# Patient Record
Sex: Female | Born: 1975
Health system: Southern US, Community
[De-identification: ages and names within clinical notes are randomized; demographics above are authoritative.]

## PROBLEM LIST (undated history)

## (undated) DIAGNOSIS — T8339XA Other mechanical complication of intrauterine contraceptive device, initial encounter: Secondary | ICD-10-CM

## (undated) DIAGNOSIS — T7840XA Allergy, unspecified, initial encounter: Secondary | ICD-10-CM

## (undated) DIAGNOSIS — R519 Headache, unspecified: Secondary | ICD-10-CM

## (undated) DIAGNOSIS — T8332XA Displacement of intrauterine contraceptive device, initial encounter: Secondary | ICD-10-CM

## (undated) DIAGNOSIS — F32A Depression, unspecified: Secondary | ICD-10-CM

## (undated) DIAGNOSIS — F329 Major depressive disorder, single episode, unspecified: Secondary | ICD-10-CM

## (undated) DIAGNOSIS — F419 Anxiety disorder, unspecified: Secondary | ICD-10-CM

## (undated) DIAGNOSIS — R87629 Unspecified abnormal cytological findings in specimens from vagina: Secondary | ICD-10-CM

## (undated) DIAGNOSIS — N390 Urinary tract infection, site not specified: Secondary | ICD-10-CM

## (undated) DIAGNOSIS — F988 Other specified behavioral and emotional disorders with onset usually occurring in childhood and adolescence: Secondary | ICD-10-CM

## (undated) DIAGNOSIS — G4700 Insomnia, unspecified: Secondary | ICD-10-CM

## (undated) DIAGNOSIS — N912 Amenorrhea, unspecified: Secondary | ICD-10-CM

## (undated) HISTORY — DX: Anxiety disorder, unspecified: F41.9

## (undated) HISTORY — DX: Other specified behavioral and emotional disorders with onset usually occurring in childhood and adolescence: F98.8

## (undated) HISTORY — PX: BILATERAL SALPINGECTOMY: SHX5743

## (undated) HISTORY — DX: Unspecified abnormal cytological findings in specimens from vagina: R87.629

## (undated) HISTORY — DX: Headache, unspecified: R51.9

## (undated) HISTORY — DX: Allergy, unspecified, initial encounter: T78.40XA

## (undated) HISTORY — DX: Displacement of intrauterine contraceptive device, initial encounter: T83.32XA

## (undated) HISTORY — DX: Other mechanical complication of intrauterine contraceptive device, initial encounter: T83.39XA

## (undated) HISTORY — DX: Amenorrhea, unspecified: N91.2

## (undated) HISTORY — PX: HERNIA REPAIR: SHX51

## (undated) HISTORY — PX: CHOLECYSTECTOMY: SHX55

## (undated) HISTORY — PX: OTHER SURGICAL HISTORY: SHX169

## (undated) HISTORY — DX: Urinary tract infection, site not specified: N39.0

## (undated) HISTORY — DX: Major depressive disorder, single episode, unspecified: F32.9

## (undated) HISTORY — DX: Depression, unspecified: F32.A

## (undated) HISTORY — PX: BACK SURGERY: SHX140

## (undated) HISTORY — DX: Insomnia, unspecified: G47.00

---

## 2006-10-25 ENCOUNTER — Emergency Department: Payer: Self-pay | Admitting: Emergency Medicine

## 2009-12-18 ENCOUNTER — Ambulatory Visit: Payer: Self-pay | Admitting: Family Medicine

## 2010-06-18 ENCOUNTER — Emergency Department: Payer: Self-pay | Admitting: Emergency Medicine

## 2010-06-21 ENCOUNTER — Emergency Department: Payer: Self-pay | Admitting: Emergency Medicine

## 2012-03-30 ENCOUNTER — Emergency Department: Payer: Self-pay | Admitting: Emergency Medicine

## 2012-04-23 ENCOUNTER — Ambulatory Visit: Payer: Self-pay | Admitting: Surgery

## 2012-04-23 LAB — PREGNANCY, URINE: Pregnancy Test, Urine: NEGATIVE m[IU]/mL

## 2012-04-30 ENCOUNTER — Ambulatory Visit: Payer: Self-pay | Admitting: Surgery

## 2012-04-30 LAB — PREGNANCY, URINE: Pregnancy Test, Urine: NEGATIVE m[IU]/mL

## 2014-12-23 ENCOUNTER — Inpatient Hospital Stay: Payer: Self-pay | Admitting: Internal Medicine

## 2014-12-23 LAB — COMPREHENSIVE METABOLIC PANEL
Albumin: 3.3 g/dL — ABNORMAL LOW (ref 3.4–5.0)
Alkaline Phosphatase: 100 U/L (ref 46–116)
Anion Gap: 9 (ref 7–16)
BILIRUBIN TOTAL: 0.5 mg/dL (ref 0.2–1.0)
BUN: 8 mg/dL (ref 7–18)
CHLORIDE: 104 mmol/L (ref 98–107)
Calcium, Total: 9 mg/dL (ref 8.5–10.1)
Co2: 24 mmol/L (ref 21–32)
Creatinine: 0.86 mg/dL (ref 0.60–1.30)
EGFR (Non-African Amer.): >60
Glucose: 121 mg/dL — ABNORMAL HIGH (ref 65–99)
Osmolality: 273 (ref 275–301)
POTASSIUM: 3.2 mmol/L — AB (ref 3.5–5.1)
SGOT(AST): 33 U/L (ref 15–37)
SGPT (ALT): 33 U/L (ref 14–63)
Sodium: 137 mmol/L (ref 136–145)
TOTAL PROTEIN: 7.3 g/dL (ref 6.4–8.2)

## 2014-12-23 LAB — URINALYSIS, COMPLETE
Bilirubin,UR: NEGATIVE
GLUCOSE, UR: NEGATIVE mg/dL (ref 0–75)
KETONE: NEGATIVE
Nitrite: NEGATIVE
Ph: 5 (ref 4.5–8.0)
RBC,UR: 23 /HPF (ref 0–5)
SPECIFIC GRAVITY: 1.011 (ref 1.003–1.030)
Squamous Epithelial: 1
WBC UR: 1111 /HPF (ref 0–5)

## 2014-12-23 LAB — CBC
HCT: 40.2 % (ref 35.0–47.0)
HGB: 13.9 g/dL (ref 12.0–16.0)
MCH: 30.7 pg (ref 26.0–34.0)
MCHC: 34.5 g/dL (ref 32.0–36.0)
MCV: 89 fL (ref 80–100)
Platelet: 289 10*3/uL (ref 150–440)
RBC: 4.52 10*6/uL (ref 3.80–5.20)
RDW: 12.3 % (ref 11.5–14.5)
WBC: 31.2 10*3/uL — AB (ref 3.6–11.0)

## 2014-12-23 LAB — LIPASE, BLOOD: Lipase: 48 U/L — ABNORMAL LOW (ref 73–393)

## 2014-12-24 LAB — HEMOGLOBIN A1C: Hemoglobin A1C: 5.1 % (ref 4.2–6.3)

## 2014-12-24 LAB — TSH: Thyroid Stimulating Horm: 2.03 u[IU]/mL

## 2014-12-25 LAB — BASIC METABOLIC PANEL
Anion Gap: 8 (ref 7–16)
BUN: 3 mg/dL — ABNORMAL LOW (ref 7–18)
CALCIUM: 7.9 mg/dL — AB (ref 8.5–10.1)
CO2: 24 mmol/L (ref 21–32)
Chloride: 108 mmol/L — ABNORMAL HIGH (ref 98–107)
Creatinine: 0.63 mg/dL (ref 0.60–1.30)
EGFR (Non-African Amer.): >60
Glucose: 105 mg/dL — ABNORMAL HIGH (ref 65–99)
Osmolality: 276 (ref 275–301)
Potassium: 3.4 mmol/L — ABNORMAL LOW (ref 3.5–5.1)
SODIUM: 140 mmol/L (ref 136–145)

## 2014-12-25 LAB — CBC WITH DIFFERENTIAL/PLATELET
Basophil #: 0.1 10*3/uL (ref 0.0–0.1)
Basophil %: 0.3 %
EOS PCT: 0.6 %
Eosinophil #: 0.1 10*3/uL (ref 0.0–0.7)
HCT: 33.4 % — AB (ref 35.0–47.0)
HGB: 11.2 g/dL — AB (ref 12.0–16.0)
LYMPHS ABS: 2.3 10*3/uL (ref 1.0–3.6)
LYMPHS PCT: 10.9 %
MCH: 30.5 pg (ref 26.0–34.0)
MCHC: 33.6 g/dL (ref 32.0–36.0)
MCV: 91 fL (ref 80–100)
MONOS PCT: 7.4 %
Monocyte #: 1.5 x10 3/mm — ABNORMAL HIGH (ref 0.2–0.9)
NEUTROS ABS: 17 10*3/uL — AB (ref 1.4–6.5)
Neutrophil %: 80.8 %
Platelet: 256 10*3/uL (ref 150–440)
RBC: 3.68 10*6/uL — AB (ref 3.80–5.20)
RDW: 12.8 % (ref 11.5–14.5)
WBC: 21 10*3/uL — ABNORMAL HIGH (ref 3.6–11.0)

## 2014-12-25 LAB — MAGNESIUM: MAGNESIUM: 1.7 mg/dL — AB

## 2014-12-26 LAB — BASIC METABOLIC PANEL
Anion Gap: 5 — ABNORMAL LOW (ref 7–16)
BUN: 2 mg/dL — ABNORMAL LOW (ref 7–18)
CALCIUM: 8.7 mg/dL (ref 8.5–10.1)
CO2: 24 mmol/L (ref 21–32)
Chloride: 107 mmol/L (ref 98–107)
Creatinine: 0.65 mg/dL (ref 0.60–1.30)
EGFR (Non-African Amer.): >60
Glucose: 92 mg/dL (ref 65–99)
OSMOLALITY: 268 (ref 275–301)
Potassium: 4.4 mmol/L (ref 3.5–5.1)
Sodium: 136 mmol/L (ref 136–145)

## 2014-12-26 LAB — CBC WITH DIFFERENTIAL/PLATELET
BASOS PCT: 0.6 %
Basophil #: 0.1 10*3/uL (ref 0.0–0.1)
EOS PCT: 1.5 %
Eosinophil #: 0.2 10*3/uL (ref 0.0–0.7)
HCT: 33.8 % — AB (ref 35.0–47.0)
HGB: 11.8 g/dL — AB (ref 12.0–16.0)
LYMPHS PCT: 19.8 %
Lymphocyte #: 3.1 10*3/uL (ref 1.0–3.6)
MCH: 31.2 pg (ref 26.0–34.0)
MCHC: 34.9 g/dL (ref 32.0–36.0)
MCV: 89 fL (ref 80–100)
Monocyte #: 0.9 x10 3/mm (ref 0.2–0.9)
Monocyte %: 5.7 %
NEUTROS ABS: 11.5 10*3/uL — AB (ref 1.4–6.5)
Neutrophil %: 72.4 %
Platelet: 311 10*3/uL (ref 150–440)
RBC: 3.79 10*6/uL — ABNORMAL LOW (ref 3.80–5.20)
RDW: 12.6 % (ref 11.5–14.5)
WBC: 15.8 10*3/uL — ABNORMAL HIGH (ref 3.6–11.0)

## 2014-12-26 LAB — URINE CULTURE

## 2014-12-29 LAB — CULTURE, BLOOD (SINGLE)

## 2015-03-12 ENCOUNTER — Ambulatory Visit
Admit: 2015-03-12 | Disposition: A | Discharge status: Home / Self Care | Payer: Self-pay | Attending: Obstetrics and Gynecology | Admitting: Obstetrics and Gynecology

## 2015-03-12 LAB — CBC
HCT: 41.3 % (ref 35.0–47.0)
HGB: 14.2 g/dL (ref 12.0–16.0)
MCH: 31.1 pg (ref 26.0–34.0)
MCHC: 34.4 g/dL (ref 32.0–36.0)
MCV: 90 fL (ref 80–100)
PLATELETS: 304 10*3/uL (ref 150–440)
RBC: 4.58 10*6/uL (ref 3.80–5.20)
RDW: 12.8 % (ref 11.5–14.5)
WBC: 13.3 10*3/uL — ABNORMAL HIGH (ref 3.6–11.0)

## 2015-03-16 ENCOUNTER — Ambulatory Visit
Admit: 2015-03-16 | Disposition: A | Discharge status: Home / Self Care | Payer: Self-pay | Attending: Obstetrics and Gynecology | Admitting: Obstetrics and Gynecology

## 2015-03-16 LAB — RAPID HIV SCREEN (HIV 1/2 AB+AG)

## 2015-03-22 NOTE — Op Note (Signed)
PATIENT NAME:  [name], Kathleen M MR#:  [id number] DATE OF BIRTH:  May 18, 1976  DATE OF PROCEDURE:  04/30/2012  PREOPERATIVE DIAGNOSIS: Incarcerated supraumbilical ventral hernia.   POSTOPERATIVE DIAGNOSIS: Incarcerated supraumbilical ventral hernia.   PROCEDURE PERFORMED: Repair of ventral hernia with primary technique.   SURGEON: Marguerite Jarboe A. Egbert GaribaldiBird, MD    ASSISTANT: None.   ANESTHESIA: General with 30 mL of 0.25% plain Marcaine.   FINDINGS: There was a 1 cm x 0.5 cm in AP direction ventral fascial defect with incarcerated lobule of preperitoneal fat.   SPECIMENS: None.   DESCRIPTION OF PROCEDURE: With the patient in the supine position, general endotracheal anesthesia was induced. Her abdomen was widely prepped and draped with ChloraPrep solution followed by [place]. Surgical time-out was observed.   A primary incision centered over the mass was fashioned with scalpel measuring 3 cm. Subcutaneous tissues were divided with electrocautery. The hernia defect was identified. Preperitoneal incarcerated fat was identified. Fascial edges were identified. Preperitoneal fat was able to be reduced back into the preperitoneal space. Fascial defect at this point laid along a transverse orientation 1 cm wide and 0.5 cm in cranial caudad direction. Given its size, it was closed primarily utilizing interrupted Ethibond sutures in a pants-over-vest technique. With the fascia closed, a total of 30 mL of 0.25% plain Marcaine was infiltrated along all skin and fascial incisions. Subcutaneous tissues were obliterated with 3-0 Vicryl. 4-0 Vicryl subcuticular in the skin was applied followed by benzoin, Steri-Strips, Telfa, and Tegaderm.  The patient was then subsequently extubated and taken to the recovery room in stable and satisfactory condition by anesthesia services.   ____________________________ Redge GainerMark A. Egbert GaribaldiBird, MD mab:drc D: 04/30/2012 08:16:39 ET T: 04/30/2012 10:00:33 ET JOB#: [id number]  cc: Loraine LericheMark A. Egbert GaribaldiBird, MD,  <Dictator> Darrio Bade A Onelia Cadmus MD ELECTRONICALLY SIGNED 04/30/2012 18:02

## 2015-03-23 LAB — SURGICAL PATHOLOGY

## 2015-03-29 NOTE — Op Note (Signed)
PATIENT NAME:  Kathleen Zavala, [name] MR#:  [id number] DATE OF BIRTH:  01/30/1976  DATE OF PROCEDURE:  03/16/2015  PREOPERATIVE DIAGNOSES:  1. Perforated intrauterine device.  2. Desires elective sterilization.   POSTOPERATIVE DIAGNOSES: 1. Perforated intrauterine device.  2. Desires elective sterilization.   OPERATIVE PROCEDURE:  1. Laparoscopic bilateral salpingectomy.  2. Laparoscopic excision/removal of intrauterine device.   SURGEON:  Dr. Greggory KeeneFrancesco.   FIRST ASSISTANT:  Dr. Valentino Saxonherry.   SECOND ASSISTANT:  Acquanetta ChainNicole Trittschuh, PA-S.   ANESTHESIA:  General endotracheal.   INDICATIONS:  The patient is a 39 year old African American female with known IUD perforation through the uterus as noted on CT scan, presents for laparoscopic removal of IUD and surgical sterilization procedure.   FINDINGS AT SURGERY:  Revealed a fundal uterine perforation with the Mirena IUD with the cross arms being identified in the abdominal cavity at the uterine fundus.  The fallopian tubes and ovaries were normal.  Uterus was normal.  Pelvis was gynecoid, and the patient is an excellent TVH candidate if hysterectomy is necessary.   DESCRIPTION OF PROCEDURE:  The patient was brought to the operating room where she was placed in the supine position.  General endotracheal anesthesia was induced without difficulty.  She was placed in the dorsolithotomy position using the Bumble Bee stirrups.  A ChloraPrep and Betadine abdominal, perineal, and intravaginal prep and drape was performed in standard fashion.  A red Robinson catheter was used to drain 100 mL of urine from the bladder.  A Hulka tenaculum was placed onto the cervix to facilitate uterine manipulation.  A subumbilical vertical incision 5 mm in length was made.  The Optiview laparoscopic trocar system was used to place a 5 mm trocar directly into the abdominopelvic cavity without evidence of bowel or vascular injury.  Two other 5 mm ports were placed in the right and  left lower quadrants, respectively.  The above-noted findings were photo documented.  The bilateral salpingectomy was performed in standard fashion using graspers and the Ace Harmonic Scalpel.  The mesosalpinx was grasped and desiccated and cut in a sequential manner to remove the fallopian tubes.  Good hemostasis was noted.  The tubes were removed through the 5 mm port on the left lower quadrant incision site.  A similar procedure was carried out with the contralateral tube.  Next, the IUD was noted to be in the midline fundus region with the cross arms being extruded into the abdominal cavity.  The adhesions involving the serosa and myometrium were taken down with the Ace Harmonic Scalpel.  A grasper was used to remove the IUD in its entirety through the fundus.  This was extracted through the 5 mm port.  Copious irrigation of the fundus was performed with the irrigant fluid being aspirated.  Good hemostasis was noted.  Upon completion of the procedure, all instrumentation was removed from the abdomen.  Pneumoperitoneum was released.  The incisions were closed with 4-0 Vicryl sutures and Tegaderm dressings were placed.  The patient was then awakened, extubated, and taken to the recovery room in satisfactory condition.   ESTIMATED BLOOD LOSS:  Minimal.   IV FLUIDS:  600 mL.   URINE OUTPUT:  100 mL.   ALL INSTRUMENT, NEEDLE, AND SPONGE COUNTS:  Verified as correct.    ____________________________ Prentice DockerMartin A. Teneka Malmberg, MD mad:kc D: 03/16/2015 08:49:46 ET T: 03/16/2015 15:07:05 ET JOB#: [id number]  cc: Daphine DeutscherMartin A. Denisa Enterline, MD, <Dictator> [facility] Prentice DockerMARTIN A Eric Morganti MD ELECTRONICALLY SIGNED 03/23/2015 8:49

## 2015-03-29 NOTE — Discharge Summary (Signed)
PATIENT NAME:  Kathleen Zavala, Kathleen Zavala MR#:  [id number] DATE OF BIRTH:  [birth year]  DATE OF ADMISSION:  12/23/2014 DATE OF DISCHARGE:  12/27/2014  ADMITTING DIAGNOSES: 1.  Fever.  2.  Abdominal pain.   DISCHARGE DIAGNOSES:  1.  Fever.  2.  Abdominal pain.  3.  Flank pain due to urinary tract infection due to Escherichia coli.  4.  Uterine perforation with intrauterine device malplacement. The patient was seen by gynecology, who will arrange outpatient removal of this device. It is felt that this has been malpositioned like this for a prolonged period of time.   CONSULTANTS: Prentice DockerMartin A. DeFrancesco, MD  PERTINENT LABORATORIES AND EVALUATIONS: Admitting glucose 121, BUN 8, creatinine 0.86, sodium 137, potassium 3.2, chloride 104, CO2 of 24, calcium 9.0. LFTs were normal except albumin of 3.3. WBC count 31.2, hemoglobin 13.9, platelet count was 289,000. WBC count today is 15.8. Blood cultures: No growth. Urine cultures showed E. coli pansensitive. Urinalysis: 3+ leukocytes, WBCs 111, bacteria 1+.   HOSPITAL COURSE: Please refer to H and P done by the admitting physician. The patient is a 39 year old female who presented with abdominal pain, fevers. She had a CT scan of the abdomen which showed findings consistent with early pyelonephritis. Also, she had an incidental note of a uterine device that was misplaced, with uterine rupture. This has been apparently known about in the past. Due to this, she was admitted to the hospital. For pyelonephritis, a GYN consult was obtained. Dr. Greggory KeeneFrancesco will follow her up as an outpatient for the IUD removal. The patient's temperature started resolving. She was noted to have a urinary tract infection with E.  coli. The patient was treated with ceftriaxone and resolution of the fever. The patient also had incidental pulmonary nodules noted, for which she will need outpatient followup. At this time, she is doing much better and is stable for discharge.   DISCHARGE  MEDICATIONS: Xanax 1 mg t.i.d., Adderall 10 mg 1 tablet p.o. t.i.d., Ambien 10 at bedtime p.r.n., B12 shots once monthly, Vicodin ES 2 tabs t.i.d., Cipro 500 mg 1 tab p.o. q. 12 x 5 days.   DIET: Regular.   ACTIVITY: As tolerated.  FOLLOWUP: With MD in 1 to 2 weeks; 4 to 6 weeks with pulmonary doctor, Dr. Freda MunroSaadat Khan, for pulmonary nodules; GYN Dr. Greggory KeeneFrancesco.   TIME SPENT: 40 minutes.    ____________________________ Lacie ScottsShreyang H. Allena KatzPatel, MD shp:ST D: 12/27/2014 12:12:44 ET T: 12/28/2014 01:46:09 ET JOB#: [id number]  cc: Jamisyn Langer H. Allena KatzPatel, MD, <Dictator> Charise CarwinSHREYANG H Jay Kempe MD ELECTRONICALLY SIGNED 12/29/2014 8:57
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
## 2015-09-23 ENCOUNTER — Ambulatory Visit: Payer: Self-pay | Admitting: Obstetrics and Gynecology

## 2015-10-06 ENCOUNTER — Emergency Department
Admission: EM | Admit: 2015-10-06 | Discharge: 2015-10-06 | Disposition: A | Discharge status: Other Acute Inpatient Hospital | Payer: Medicare Other | Attending: Emergency Medicine | Admitting: Emergency Medicine

## 2015-10-06 ENCOUNTER — Encounter: Payer: Self-pay | Admitting: Emergency Medicine

## 2015-10-06 ENCOUNTER — Inpatient Hospital Stay
Admit: 2015-10-06 | Discharge: 2015-10-07 | DRG: 897 | Disposition: A | Discharge status: Home / Self Care | Payer: Medicare Other | Source: Intra-hospital | Attending: Psychiatry | Admitting: Psychiatry

## 2015-10-06 DIAGNOSIS — Z8041 Family history of malignant neoplasm of ovary: Secondary | ICD-10-CM

## 2015-10-06 DIAGNOSIS — F1124 Opioid dependence with opioid-induced mood disorder: Principal | ICD-10-CM | POA: Diagnosis present

## 2015-10-06 DIAGNOSIS — F17210 Nicotine dependence, cigarettes, uncomplicated: Secondary | ICD-10-CM | POA: Diagnosis present

## 2015-10-06 DIAGNOSIS — R45851 Suicidal ideations: Secondary | ICD-10-CM | POA: Diagnosis present

## 2015-10-06 DIAGNOSIS — R454 Irritability and anger: Secondary | ICD-10-CM | POA: Diagnosis not present

## 2015-10-06 DIAGNOSIS — F1110 Opioid abuse, uncomplicated: Secondary | ICD-10-CM | POA: Insufficient documentation

## 2015-10-06 DIAGNOSIS — F1994 Other psychoactive substance use, unspecified with psychoactive substance-induced mood disorder: Secondary | ICD-10-CM | POA: Diagnosis not present

## 2015-10-06 DIAGNOSIS — F17200 Nicotine dependence, unspecified, uncomplicated: Secondary | ICD-10-CM | POA: Diagnosis present

## 2015-10-06 DIAGNOSIS — Z3202 Encounter for pregnancy test, result negative: Secondary | ICD-10-CM | POA: Insufficient documentation

## 2015-10-06 DIAGNOSIS — Z79899 Other long term (current) drug therapy: Secondary | ICD-10-CM | POA: Diagnosis not present

## 2015-10-06 DIAGNOSIS — Z90722 Acquired absence of ovaries, bilateral: Secondary | ICD-10-CM | POA: Diagnosis not present

## 2015-10-06 DIAGNOSIS — Z803 Family history of malignant neoplasm of breast: Secondary | ICD-10-CM

## 2015-10-06 DIAGNOSIS — Z9889 Other specified postprocedural states: Secondary | ICD-10-CM

## 2015-10-06 DIAGNOSIS — F604 Histrionic personality disorder: Secondary | ICD-10-CM

## 2015-10-06 DIAGNOSIS — F1010 Alcohol abuse, uncomplicated: Secondary | ICD-10-CM | POA: Diagnosis present

## 2015-10-06 DIAGNOSIS — Z823 Family history of stroke: Secondary | ICD-10-CM | POA: Diagnosis not present

## 2015-10-06 DIAGNOSIS — F329 Major depressive disorder, single episode, unspecified: Secondary | ICD-10-CM | POA: Diagnosis present

## 2015-10-06 DIAGNOSIS — F603 Borderline personality disorder: Secondary | ICD-10-CM

## 2015-10-06 DIAGNOSIS — F1020 Alcohol dependence, uncomplicated: Secondary | ICD-10-CM | POA: Diagnosis present

## 2015-10-06 DIAGNOSIS — F1120 Opioid dependence, uncomplicated: Secondary | ICD-10-CM | POA: Diagnosis present

## 2015-10-06 DIAGNOSIS — G8929 Other chronic pain: Secondary | ICD-10-CM | POA: Diagnosis present

## 2015-10-06 DIAGNOSIS — Z720 Tobacco use: Secondary | ICD-10-CM | POA: Diagnosis not present

## 2015-10-06 LAB — URINE DRUG SCREEN, QUALITATIVE (ARMC ONLY)
Amphetamines, Ur Screen: NOT DETECTED
BARBITURATES, UR SCREEN: NOT DETECTED
BENZODIAZEPINE, UR SCRN: NOT DETECTED
COCAINE METABOLITE, UR ~~LOC~~: NOT DETECTED
Cannabinoid 50 Ng, Ur ~~LOC~~: NOT DETECTED
MDMA (Ecstasy)Ur Screen: NOT DETECTED
Methadone Scn, Ur: NOT DETECTED
OPIATE, UR SCREEN: POSITIVE — AB
PHENCYCLIDINE (PCP) UR S: NOT DETECTED
Tricyclic, Ur Screen: NOT DETECTED

## 2015-10-06 LAB — CBC
HEMATOCRIT: 44.1 % (ref 35.0–47.0)
HEMOGLOBIN: 15 g/dL (ref 12.0–16.0)
MCH: 30.7 pg (ref 26.0–34.0)
MCHC: 34 g/dL (ref 32.0–36.0)
MCV: 90.2 fL (ref 80.0–100.0)
Platelets: 365 10*3/uL (ref 150–440)
RBC: 4.89 MIL/uL (ref 3.80–5.20)
RDW: 12.6 % (ref 11.5–14.5)
WBC: 11.8 10*3/uL — ABNORMAL HIGH (ref 3.6–11.0)

## 2015-10-06 LAB — ETHANOL: Alcohol, Ethyl (B): 120 mg/dL — ABNORMAL HIGH (ref <5)

## 2015-10-06 LAB — COMPREHENSIVE METABOLIC PANEL
ALBUMIN: 4.3 g/dL (ref 3.5–5.0)
ALK PHOS: 63 U/L (ref 38–126)
ALT: 15 U/L (ref 14–54)
AST: 19 U/L (ref 15–41)
Anion gap: 8 (ref 5–15)
BILIRUBIN TOTAL: 0.6 mg/dL (ref 0.3–1.2)
BUN: 13 mg/dL (ref 6–20)
CALCIUM: 9.1 mg/dL (ref 8.9–10.3)
CO2: 29 mmol/L (ref 22–32)
CREATININE: 0.46 mg/dL (ref 0.44–1.00)
Chloride: 102 mmol/L (ref 101–111)
GFR calc Af Amer: >60 mL/min (ref >60)
GFR calc non Af Amer: >60 mL/min (ref >60)
GLUCOSE: 80 mg/dL (ref 65–99)
Potassium: 4.6 mmol/L (ref 3.5–5.1)
Sodium: 139 mmol/L (ref 135–145)
TOTAL PROTEIN: 7.5 g/dL (ref 6.5–8.1)

## 2015-10-06 LAB — POCT PREGNANCY, URINE: Preg Test, Ur: NEGATIVE

## 2015-10-06 LAB — SALICYLATE LEVEL: Salicylate Lvl: <4 mg/dL (ref 2.8–30.0)

## 2015-10-06 LAB — ACETAMINOPHEN LEVEL: Acetaminophen (Tylenol), Serum: <10 ug/mL — ABNORMAL LOW (ref 10–30)

## 2015-10-06 MED ORDER — NICOTINE 21 MG/24HR TD PT24
21.0000 mg | MEDICATED_PATCH | Freq: Every day | TRANSDERMAL | Status: DC
  Administered 2015-10-06 – 2015-10-07 (×2): 21 mg via TRANSDERMAL
  Filled 2015-10-06 (×2): qty 1

## 2015-10-06 MED ORDER — FOLIC ACID 1 MG PO TABS: 1.0000 mg | ORAL_TABLET | Freq: Every day | ORAL | Status: DC

## 2015-10-06 MED ORDER — LORAZEPAM 1 MG PO TABS: 1.0000 mg | ORAL_TABLET | Freq: Four times a day (QID) | ORAL | Status: DC | PRN

## 2015-10-06 MED ORDER — LORAZEPAM 2 MG PO TABS
0.0000 mg | ORAL_TABLET | Freq: Four times a day (QID) | ORAL | Status: DC
Start: 2015-10-06 — End: 2015-10-06

## 2015-10-06 MED ORDER — LORAZEPAM 2 MG PO TABS: 0.0000 mg | ORAL_TABLET | Freq: Four times a day (QID) | ORAL | Status: DC

## 2015-10-06 MED ORDER — VITAMIN B-1 100 MG PO TABS: 100.0000 mg | ORAL_TABLET | Freq: Every day | ORAL | Status: DC

## 2015-10-06 MED ORDER — HYDROCODONE-ACETAMINOPHEN 5-325 MG PO TABS
1.0000 | ORAL_TABLET | Freq: Once | ORAL | Status: AC
Start: 2015-10-06 — End: 2015-10-06
  Administered 2015-10-06: 1 via ORAL

## 2015-10-06 MED ORDER — HYDROCODONE-ACETAMINOPHEN 5-325 MG PO TABS
ORAL_TABLET | ORAL | Status: AC
  Administered 2015-10-06: 1 via ORAL
  Filled 2015-10-06: qty 2

## 2015-10-06 MED ORDER — ALUM & MAG HYDROXIDE-SIMETH 200-200-20 MG/5ML PO SUSP: 30.0000 mL | ORAL | Status: DC | PRN

## 2015-10-06 MED ORDER — NICOTINE 21 MG/24HR TD PT24
MEDICATED_PATCH | TRANSDERMAL | Status: AC
  Administered 2015-10-06: 21 mg via TRANSDERMAL
  Filled 2015-10-06: qty 1

## 2015-10-06 MED ORDER — HYDROCODONE-ACETAMINOPHEN 5-325 MG PO TABS
ORAL_TABLET | ORAL | Status: AC
  Filled 2015-10-06: qty 1

## 2015-10-06 MED ORDER — THIAMINE HCL 100 MG/ML IJ SOLN: 100.0000 mg | Freq: Every day | INTRAMUSCULAR | Status: DC

## 2015-10-06 MED ORDER — HYDROCODONE-ACETAMINOPHEN 10-325 MG PO TABS: 1.0000 | ORAL_TABLET | Freq: Three times a day (TID) | ORAL | Status: DC

## 2015-10-06 MED ORDER — ADULT MULTIVITAMIN W/MINERALS CH: 1.0000 | ORAL_TABLET | Freq: Every day | ORAL | Status: DC

## 2015-10-06 MED ORDER — ONDANSETRON 4 MG PO TBDP
ORAL_TABLET | ORAL | Status: AC
  Administered 2015-10-06: 4 mg
  Filled 2015-10-06: qty 1

## 2015-10-06 MED ORDER — MAGNESIUM HYDROXIDE 400 MG/5ML PO SUSP: 30.0000 mL | Freq: Every day | ORAL | Status: DC | PRN

## 2015-10-06 MED ORDER — ACETAMINOPHEN 325 MG PO TABS
ORAL_TABLET | ORAL | Status: AC
  Filled 2015-10-06: qty 2

## 2015-10-06 MED ORDER — LORAZEPAM 2 MG PO TABS
2.0000 mg | ORAL_TABLET | Freq: Three times a day (TID) | ORAL | Status: DC | PRN
Start: 2015-10-06 — End: 2015-10-07

## 2015-10-06 MED ORDER — IBUPROFEN 600 MG PO TABS
600.0000 mg | ORAL_TABLET | Freq: Four times a day (QID) | ORAL | Status: DC | PRN
  Administered 2015-10-06: 600 mg via ORAL
  Filled 2015-10-06: qty 1

## 2015-10-06 MED ORDER — ADULT MULTIVITAMIN W/MINERALS CH
1.0000 | ORAL_TABLET | Freq: Every day | ORAL | Status: DC
  Filled 2015-10-06: qty 1

## 2015-10-06 MED ORDER — LORAZEPAM 2 MG PO TABS: 0.0000 mg | ORAL_TABLET | Freq: Two times a day (BID) | ORAL | Status: DC

## 2015-10-06 MED ORDER — ACETAMINOPHEN 325 MG PO TABS
650.0000 mg | ORAL_TABLET | Freq: Once | ORAL | Status: AC
  Administered 2015-10-06: 650 mg via ORAL

## 2015-10-06 MED ORDER — ACETAMINOPHEN 325 MG PO TABS: 650.0000 mg | ORAL_TABLET | Freq: Four times a day (QID) | ORAL | Status: DC | PRN

## 2015-10-06 MED ORDER — LORAZEPAM 2 MG/ML IJ SOLN: 1.0000 mg | Freq: Four times a day (QID) | INTRAMUSCULAR | Status: DC | PRN

## 2015-10-06 MED ORDER — CHLORDIAZEPOXIDE HCL 25 MG PO CAPS
25.0000 mg | ORAL_CAPSULE | Freq: Three times a day (TID) | ORAL | Status: DC
  Administered 2015-10-06 – 2015-10-07 (×2): 25 mg via ORAL
  Filled 2015-10-06 (×2): qty 1

## 2015-10-06 NOTE — Progress Notes (Signed)
Contacted by nursing at 8:50 pm as pt is refusing all meds.  Says she is prescribed with adderall, xanax and vicodin.  Per assessment there is no evidence pt has been prescribed with any of this agents.  Alcohol level was 120.  Urine tox was only + for opiates. [place] controlled substance database was checked: pt has not been prescribed with theses agents.  I will d/c norco.  Will start librium 25 mg tid for alcohol withdrawal.  Will change vitals to tid and CIWA to q 8 h.  Pt stable at this time no evidence of withdrawal from alcohol or benzodiazepines.
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
## 2015-10-06 NOTE — ED Notes (Signed)
Dr Toni Amendlapacs here to see pt , pt is to be adm , pt is very upset about this. Pt medciated for chronic headache per order,pt and family c/o about care, says she did not eat ( she did ) and that she had no shower . I again offered her a shower and she stated I am not using the bathroom or the shower here they are to dirty i will wait until i am home. I do not want to be with other crazy people i want my own room.

## 2015-10-06 NOTE — ED Notes (Signed)
Pt  Presents to ED with suicidal ideation. Expressed thoughts of wanting to kill herself to her friends tonight after getting into an argument with her husband. Pt angry and uncooperative. States she just said it because she was mad and didn't mean it. States if she was going to kill herself she would have done it by now. Pt states she does not want anyone to "know her business here tonight" and would like to keep her visit completely private.

## 2015-10-06 NOTE — ED Provider Notes (Signed)
-----------------------------------------   5:10 PM on 10/06/2015 -----------------------------------------  Psychiatry has seen the patient and will be admitting to their service for further treatment and evaluation.  Minna AntisKevin Saliyah Gillin, MD 10/06/15 1710

## 2015-10-06 NOTE — ED Notes (Signed)
Pt in room.Pt states "I don't want to be here" Refusing to answer questions for assessment by myself and Dr Manson PasseyBrown. Pt laying on stretcher with arm over her eyes and other in front of pants at this time. Pt rocking, shaking, agitated at this time.  Will continue to monitor with q15 min checks. ODS officer in area.

## 2015-10-06 NOTE — ED Notes (Signed)
Myself and Flow Coordinator Lea F RN attempted to obtain blood for lab testing and were not successful. Pt drinking water and lemon lime soda so that she can urinate for UDS.
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
## 2015-10-06 NOTE — ED Notes (Signed)
ENVIRONMENTAL ASSESSMENT Potentially harmful objects out of patient reach: YES Personal belongings secured: YES Patient dressed in hospital provided attire only: YES Plastic bags out of patient reach: YES Patient care equipment (cords, cables, call bells, lines, and drains) shortened, removed, or accounted for: YES Equipment and supplies removed from bottom of stretcher: YES Potentially toxic materials out of patient reach: YES Sharps container removed or out of patient reach: YES  BEHAVIORAL HEALTH ROUNDING Patient sleeping: YES Patient alert and oriented: YES Behavior appropriate: YES Describe behavior: No inappropriate or unacceptable behaviors noted at this time.  Nutrition and fluids offered: YES Toileting and hygiene offered: YES Sitter present: Behavioral tech rounding every 15 minutes on patient to ensure safety.  Law enforcement present: YES Law enforcement agency: [employer] ([employer]) 
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
## 2015-10-06 NOTE — ED Notes (Signed)
Pt given breakfast tray
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
## 2015-10-06 NOTE — ED Provider Notes (Addendum)
-----------------------------------------   10:13 AM on 10/06/2015 -----------------------------------------  He shouldn't was medically cleared by prior physician for questionable IVC outpatient for behavioral issues. Patient states she is taken a Vicodin every day of her life and if we do not give her Vicodin here she will withdrawal. She is adamant that she needs a Vicodin. We'll give her a single Vicodin to forestall further escalation or withdrawal  Kathleen PlantJames A McShane, MD 10/06/15 1013  Kathleen PlantJames A McShane, MD 10/06/15 1014
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
## 2015-10-06 NOTE — ED Notes (Signed)
BEHAVIORAL HEALTH ROUNDING Patient sleeping: Yes.   Patient alert and oriented: not applicable Behavior appropriate: Yes.    Nutrition and fluids offered: No Toileting and hygiene offered: No Sitter present: q15 minute observations Law enforcement present: Yes [employer] 

## 2015-10-06 NOTE — ED Notes (Signed)
Pt removed 2 hairclips as well. States all other body jewelry (listed in previous note) are imbedded and have to be surgically removed.

## 2015-10-06 NOTE — ED Notes (Signed)
Pt denies SI, states her head is hurting and that she takes Vicodin every day for head pain. Dr Alphonzo LemmingsMcShane made aware. Order obtained.

## 2015-10-06 NOTE — ED Notes (Signed)
BEHAVIORAL HEALTH ROUNDING Patient sleeping: No. Patient alert and oriented: yes Behavior appropriate: No, pt being aggressive to staff, refusing to take piercing's out per request when dressed out. .   Nutrition and fluids offered: Yes  Toileting and hygiene offered: Yes  Sitter present: q15 min observations Law enforcement present: Yes [employer]

## 2015-10-06 NOTE — ED Notes (Signed)
Pt continues to refuse removal of her piercing's, other than her belly button ring. 1 Nose, 1 lip, 6 right ear, 4 left ear, 1 on each breast remain intact.

## 2015-10-06 NOTE — ED Notes (Signed)

## 2015-10-06 NOTE — ED Notes (Signed)
Pt in room. No complaints or concerns voiced at this time. No abnormal behavior noted at this time. Will continue to monitor with q15 min checks. [employer] officer in area. 

## 2015-10-06 NOTE — ED Notes (Signed)
Pt crying in bed, states her head is "killing" her. Vicodin given.

## 2015-10-06 NOTE — Tx Team (Signed)
Initial Interdisciplinary Treatment Plan   PATIENT STRESSORS: Health problems Traumatic event   PATIENT STRENGTHS: Capable of independent living Communication skills   PROBLEM LIST: Problem List/Patient Goals Date to be addressed Date deferred Reason deferred Estimated date of resolution  Depression  11/8           Suicidal Ideation  11/8                                          DISCHARGE CRITERIA:  Improved stabilization in mood, thinking, and/or behavior Verbal commitment to aftercare and medication compliance  PRELIMINARY DISCHARGE PLAN: Outpatient therapy Return to previous living arrangement  PATIENT/FAMIILY INVOLVEMENT: This treatment plan has been presented to and reviewed with the patient, Kathleen Zavala, and/or family member, .  The patient and family have been given the opportunity to ask questions and make suggestions.  Kathleen Zavala 10/06/2015, 6:10 PM

## 2015-10-07 ENCOUNTER — Encounter: Payer: Self-pay | Admitting: Psychiatry

## 2015-10-07 DIAGNOSIS — F1120 Opioid dependence, uncomplicated: Secondary | ICD-10-CM | POA: Diagnosis present

## 2015-10-07 DIAGNOSIS — F17200 Nicotine dependence, unspecified, uncomplicated: Secondary | ICD-10-CM | POA: Diagnosis present

## 2015-10-07 DIAGNOSIS — F1994 Other psychoactive substance use, unspecified with psychoactive substance-induced mood disorder: Secondary | ICD-10-CM
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
## 2015-10-07 NOTE — Tx Team (Signed)
Initial Interdisciplinary Treatment Plan   PATIENT STRESSORS: Substance abuse   PATIENT STRENGTHS: Average or above average intelligence   PROBLEM LIST: Problem List/Patient Goals Date to be addressed Date deferred Reason deferred Estimated date of resolution  depression 10/06/2015     Substance abuse 10/06/2015                                                DISCHARGE CRITERIA:  Improved stabilization in mood, thinking, and/or behavior  PRELIMINARY DISCHARGE PLAN: Return to previous living arrangement  PATIENT/FAMIILY INVOLVEMENT: This treatment plan has been presented to and reviewed with the patient, Kathleen Zavala, and/or family membe.  The patient and family have been given the opportunity to ask questions and make suggestions.  Kathleen Zavala 10/07/2015, 3:33 AM

## 2015-10-07 NOTE — Progress Notes (Signed)
Patient discharged home with son. Instructions reviewed. Patient verbalized understanding of meds and follow up care. Denies SI/HI/AVH. Denies feeling depressed. Belongings returned.

## 2015-10-07 NOTE — [facility] Suicide Risk Assessment (Signed)
Kathleen Zavala Admission Suicide Risk Assessment   Nursing information obtained from:    Demographic factors:    Current Mental Status:    Loss Factors:    Historical Factors:    Risk Reduction Factors:    Total Time spent with patient: 1 hour Principal Problem: Substance induced mood disorder ([facility]) Diagnosis:   Patient Active Problem List   Diagnosis Date Noted  . Opioid use disorder, moderate, dependence ([facility]) [F11.20] 10/07/2015  . Tobacco use disorder [F17.200] 10/07/2015  . Substance induced mood disorder ([facility]) [F19.94] 10/06/2015  . Alcohol use disorder, moderate, dependence ([facility]) [F10.20] 10/06/2015  . 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 Chronic pain [G89.29] 10/06/2015     Continued Clinical Symptoms:  Alcohol Use Disorder Identification Test Final Score (AUDIT): 18 The "Alcohol Use Disorders Identification Test", Guidelines for Use in Primary Care, Second Edition.  World Science writer [facility]). Score between 0-7:  no or low risk or alcohol related problems. Score between 8-15:  moderate risk of alcohol related problems. Score between 16-19:  high risk of alcohol related problems. Score 20 or above:  warrants further diagnostic evaluation for alcohol dependence and treatment.   CLINICAL FACTORS:   Depression:   Impulsivity Alcohol/Substance Abuse/Dependencies   Musculoskeletal: Strength & Muscle Tone: within normal limits Gait & Station: normal Patient leans: N/A  Psychiatric Specialty Exam: Exam performed in the emergency room and agree with the findings. Physical Exam  Nursing note and vitals reviewed.   Review of Systems  Neurological: Positive for headaches.  All other systems reviewed and are negative.   Blood pressure 103/74, pulse 99, temperature 98.2 F (36.8 C), temperature source Oral, resp. rate 16, height 5" (0.127 m), weight 55.7 kg (122 lb 12.7 oz), last menstrual period 10/06/2015, SpO2 100 %.Body mass index is 3,453.41 kg/(m^2).  General  Appearance: Casual  Eye Contact::  Good  Speech:  Clear and Coherent  Volume:  Normal  Mood:  Euthymic  Affect:  Appropriate  Thought Process:  Goal Directed  Orientation:  Full (Time, Place, and Person)  Thought Content:  WDL  Suicidal Thoughts:  No  Homicidal Thoughts:  No  Memory:  Immediate;   Fair Recent;   Fair Remote;   Fair  Judgement:  Fair  Insight:  Present  Psychomotor Activity:  Normal  Concentration:  Fair  Recall:  [employer] of Knowledge:Fair  Language: Fair  Akathisia:  No  Handed:  Right  AIMS (if indicated):     Assets:  Communication Skills Desire for Improvement Financial Resources/Insurance Housing Intimacy Physical Health Resilience Social Support Transportation  Sleep:  Number of Hours: 7.3  Cognition: WNL  ADL's:  Intact     COGNITIVE FEATURES THAT CONTRIBUTE TO RISK:  None    SUICIDE RISK:   Minimal: No identifiable suicidal ideation.  Patients presenting with no risk factors but with morbid ruminations; may be classified as minimal risk based on the severity of the depressive symptoms  PLAN OF CARE: Zavala admission, medication management, substance use concerning, discharge planning.  Medical Decision Making:  New problem, with additional work up planned, Review of Psycho-Social Stressors (1), Review or order clinical lab tests (1), Review of Medication Regimen & Side Effects (2) and Review of New Medication or Change in Dosage (2)   Kathleen Zavala is a 39 year old female with no past psychiatric history admitted for suicidal threats while drunk.   1. Suicidal ideation. The patient adamantly denies any intention or plans to hurt herself or others. She is able to  contract for safety.  2. Mood. The patient denies any symptoms of depression and is not interested in pharmacotherapy.  3. Chronic pain. This is from work related injury many years ago she has been maintained on Vicodin by her outpatient provider. Few doses were given in the  Zavala. No prescription will be given.  4. Alcohol abuse. The patient denies heavy drinking. She does not need detox. There are no symptoms of withdrawal S and vital signs are stable. She is not interested in substance abuse treatment.  5. Smoking. Nicotine patch was available.  6. Disposition. She was discharged to home with her family. She will follow up with her primary psychiatrist in [place]. She does want to disclose his name. She already has appointment in place presumably.  I certify that inpatient services furnished can reasonably be expected to improve the patient's condition.   Kathleen Zavala 10/07/2015, 11:50 AM
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
## 2015-10-07 NOTE — Progress Notes (Signed)
  [facility] :  Will you be returning to the same living situation after discharge:  Yes,  home to apartment with her daughter At discharge, do you have transportation home?: Yes,  patient has a family member to pick her up and drives so has transportation to and from appts. Do you have the ability to pay for your medications: Yes,  Patient has Medicaid and Medicare  Release of information consent forms completed and in the chart;  Patient's signature needed at discharge.  Patient to Follow up at: Follow-up Information    Follow up with [facility].   Why:  For follow-up care patient may walk in MWF 8-3 for assessment for assessment. Patient did not want a referral at time of discharge.   Contact information:   [address] [place], [place] Ph [phone]       Next level of care provider has access to [facility] Link:no  Patient denies SI/HI: Yes,  patient denies SI/HI    Safety Planning and Suicide Prevention discussed: Yes,  SPE discussed with patient and her daughter Sharol HarnessStormy Thomas [phone]  Have you used any form of tobacco in the last 30 days? (Cigarettes, Smokeless Tobacco, Cigars, and/or Pipes): Yes  Has patient been referred to the Quitline?: Patient refused referral  Lulu Ridingngle, Neng Albee T, MSW, LCSWA 10/07/2015, 2:40 PM

## 2015-10-07 NOTE — [facility] Suicide Risk Assessment (Signed)
[facility] INPATIENT:  Family/Significant Other Suicide Prevention Education  Suicide Prevention Education:  Education Completed; Kathleen Zavala (daughter) [phone] has been identified by the patient as the family member/significant other with whom the patient will be residing, and identified as the person(s) who will aid the patient in the event of a mental health crisis (suicidal ideations/suicide attempt).  With written consent from the patient, the family member/significant other has been provided the following suicide prevention education, prior to the and/or following the discharge of the patient.  The suicide prevention education provided includes the following:  Suicide risk factors  Suicide prevention and interventions  National Suicide Hotline telephone number  [facility] assessment telephone number  [facility] Emergency Assistance 911  [facility] and/or Residential Mobile Crisis Unit telephone number  Request made of family/significant other to:  Remove weapons (e.g., guns, rifles, knives), all items previously/currently identified as safety concern.    Remove drugs/medications (over-the-counter, prescriptions, illicit drugs), all items previously/currently identified as a safety concern.  The family member/significant other verbalizes understanding of the suicide prevention education information provided.  The family member/significant other agrees to remove the items of safety concern listed above.  Lulu RidingIngle, Francois Elk T, MSW, LCSWA  10/07/2015, 11:26 AM

## 2015-10-07 NOTE — [facility] Suicide Risk Assessment (Signed)
[facility]   Demographic Factors:  NA  Total Time spent with patient: 1 hour  Musculoskeletal: Strength & Muscle Tone: within normal limits Gait & Station: normal Patient leans: N/A  Psychiatric Specialty Exam: Physical Exam  Nursing note and vitals reviewed.   Review of Systems  Neurological: Positive for headaches.  All other systems reviewed and are negative.   Blood pressure 103/74, pulse 99, temperature 98.2 F (36.8 C), temperature source Oral, resp. rate 16, height 5" (0.127 m), weight 55.7 kg (122 lb 12.7 oz), last menstrual period 10/06/2015, SpO2 100 %.Body mass index is 3,453.41 kg/(m^2).  See SRA.                                                     Have you used any form of tobacco in the last 30 days? (Cigarettes, Smokeless Tobacco, Cigars, and/or Pipes): Yes  Has this patient used any form of tobacco in the last 30 days? (Cigarettes, Smokeless Tobacco, Cigars, and/or Pipes) Yes, A prescription for an FDA-approved tobacco cessation medication was offered at discharge and the patient refused  Mental Status Per Nursing Assessment::   On Admission:     Current Mental Status by Physician: NA  Loss Factors: NA  Historical Factors: Impulsivity  Risk Reduction Factors:   Responsible for children under 38 years of age, Sense of responsibility to family, Living with another person, especially a relative, Positive social support and Positive therapeutic relationship  Continued Clinical Symptoms:  Depression:   Impulsivity Alcohol/Substance Abuse/Dependencies  Cognitive Features That Contribute To Risk:  None    Suicide Risk:  Minimal: No identifiable suicidal ideation.  Patients presenting with no risk factors but with morbid ruminations; may be classified as minimal risk based on the severity of the depressive symptoms  Principal Problem: Substance induced mood disorder [facility](HCC) Discharge Diagnoses:  Patient  Active Problem List   Diagnosis Date Noted  . Opioid use disorder, moderate, dependence (HCC) [F11.20] 10/07/2015  . Tobacco use disorder [F17.200] 10/07/2015  . Substance induced mood disorder (HCC) [F19.94] 10/06/2015  . Alcohol use disorder, moderate, dependence (HCC) [F10.20] 10/06/2015  . 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 Chronic pain [G89.29] 10/06/2015    Follow-up Information    Follow up with [facility].   Why:  For follow-up care patient may walk in MWF 8-3 for assessment for assessment. Patient did not want a referral at time of discharge.   Contact information:   [address] [place], [place] Ph [phone]       Plan Of Care/Follow-up recommendations:  Activity:  As tolerated. Diet:  Low sodium heart healthy. Other:  Keep follow-up appointments.  Is patient on multiple antipsychotic therapies at discharge:  No   Has Patient had three or more failed trials of antipsychotic monotherapy by history:  No  Recommended Plan for Multiple Antipsychotic Therapies: NA    Shantinique Picazo 10/07/2015, 12:28 PM
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
## 2015-10-07 NOTE — [facility] Group Notes (Signed)
[facility] Group Notes:  (Nursing/MHT/Case Management/Adjunct)  Date:  10/07/2015  Time:  1:18 PM  Type of Therapy:  Psychoeducational Skills  Participation Level:  Did Not Attend   Darrow BussingMarly S Austine Kelsay 10/07/2015, 1:18 PM

## 2016-12-29 DIAGNOSIS — F902 Attention-deficit hyperactivity disorder, combined type: Secondary | ICD-10-CM | POA: Diagnosis not present

## 2017-02-14 ENCOUNTER — Encounter: Payer: Self-pay | Admitting: Emergency Medicine

## 2017-02-14 ENCOUNTER — Emergency Department
Admission: EM | Admit: 2017-02-14 | Discharge: 2017-02-14 | Disposition: A | Discharge status: Home / Self Care | Payer: Medicare Other | Attending: Surgery | Admitting: Surgery

## 2017-02-14 ENCOUNTER — Emergency Department: Payer: Medicare Other

## 2017-02-14 DIAGNOSIS — R918 Other nonspecific abnormal finding of lung field: Secondary | ICD-10-CM | POA: Diagnosis not present

## 2017-02-14 DIAGNOSIS — Z79899 Other long term (current) drug therapy: Secondary | ICD-10-CM | POA: Diagnosis not present

## 2017-02-14 DIAGNOSIS — E876 Hypokalemia: Secondary | ICD-10-CM | POA: Insufficient documentation

## 2017-02-14 DIAGNOSIS — Z87891 Personal history of nicotine dependence: Secondary | ICD-10-CM | POA: Insufficient documentation

## 2017-02-14 DIAGNOSIS — N644 Mastodynia: Secondary | ICD-10-CM | POA: Diagnosis present

## 2017-02-14 DIAGNOSIS — N611 Abscess of the breast and nipple: Secondary | ICD-10-CM | POA: Diagnosis not present

## 2017-02-14 DIAGNOSIS — R0789 Other chest pain: Secondary | ICD-10-CM | POA: Diagnosis not present

## 2017-02-14 LAB — CBC WITH DIFFERENTIAL/PLATELET
Basophils Absolute: 0.1 10*3/uL (ref 0–0.1)
Basophils Relative: 1 %
EOS PCT: 3 %
Eosinophils Absolute: 0.3 10*3/uL (ref 0–0.7)
HCT: 34.2 % — ABNORMAL LOW (ref 35.0–47.0)
Hemoglobin: 11.8 g/dL — ABNORMAL LOW (ref 12.0–16.0)
LYMPHS PCT: 32 %
Lymphs Abs: 3.6 10*3/uL (ref 1.0–3.6)
MCH: 31.3 pg (ref 26.0–34.0)
MCHC: 34.6 g/dL (ref 32.0–36.0)
MCV: 90.4 fL (ref 80.0–100.0)
MONO ABS: 0.7 10*3/uL (ref 0.2–0.9)
MONOS PCT: 6 %
Neutro Abs: 6.6 10*3/uL — ABNORMAL HIGH (ref 1.4–6.5)
Neutrophils Relative %: 58 %
PLATELETS: 286 10*3/uL (ref 150–440)
RBC: 3.78 MIL/uL — AB (ref 3.80–5.20)
RDW: 13.4 % (ref 11.5–14.5)
WBC: 11.3 10*3/uL — ABNORMAL HIGH (ref 3.6–11.0)

## 2017-02-14 LAB — COMPREHENSIVE METABOLIC PANEL
ALBUMIN: 3.4 g/dL — AB (ref 3.5–5.0)
ALT: 12 U/L — AB (ref 14–54)
AST: 16 U/L (ref 15–41)
Alkaline Phosphatase: 52 U/L (ref 38–126)
Anion gap: 7 (ref 5–15)
BUN: 8 mg/dL (ref 6–20)
CHLORIDE: 103 mmol/L (ref 101–111)
CO2: 25 mmol/L (ref 22–32)
CREATININE: 0.68 mg/dL (ref 0.44–1.00)
Calcium: 8.8 mg/dL — ABNORMAL LOW (ref 8.9–10.3)
GFR calc Af Amer: >60 mL/min (ref >60)
GFR calc non Af Amer: >60 mL/min (ref >60)
GLUCOSE: 120 mg/dL — AB (ref 65–99)
POTASSIUM: 2.9 mmol/L — AB (ref 3.5–5.1)
Sodium: 135 mmol/L (ref 135–145)
Total Bilirubin: 0.6 mg/dL (ref 0.3–1.2)
Total Protein: 6.5 g/dL (ref 6.5–8.1)

## 2017-02-14 LAB — POCT PREGNANCY, URINE: Preg Test, Ur: NEGATIVE

## 2017-02-14 MED ORDER — OXYCODONE-ACETAMINOPHEN 5-325 MG PO TABS
ORAL_TABLET | ORAL | Status: AC
  Filled 2017-02-14: qty 2

## 2017-02-14 MED ORDER — OXYCODONE-ACETAMINOPHEN 5-325 MG PO TABS
2.0000 | ORAL_TABLET | Freq: Once | ORAL | Status: AC
  Administered 2017-02-14: 2 via ORAL

## 2017-02-14 MED ORDER — MORPHINE SULFATE (PF) 4 MG/ML IV SOLN
4.0000 mg | Freq: Once | INTRAVENOUS | Status: AC
  Administered 2017-02-14: 4 mg via INTRAVENOUS
  Filled 2017-02-14: qty 1

## 2017-02-14 MED ORDER — SODIUM CHLORIDE 0.9 % IV BOLUS (SEPSIS)
1000.0000 mL | Freq: Once | INTRAVENOUS | Status: AC
  Administered 2017-02-14: 1000 mL via INTRAVENOUS

## 2017-02-14 MED ORDER — ONDANSETRON HCL 4 MG/2ML IJ SOLN
4.0000 mg | Freq: Once | INTRAMUSCULAR | Status: AC
  Administered 2017-02-14: 4 mg via INTRAVENOUS
  Filled 2017-02-14: qty 2

## 2017-02-14 MED ORDER — POTASSIUM CHLORIDE ER 20 MEQ PO TBCR: 20.0000 meq | EXTENDED_RELEASE_TABLET | Freq: Every day | ORAL | 0 refills | Status: DC

## 2017-02-14 MED ORDER — CLINDAMYCIN HCL 300 MG PO CAPS: 300.0000 mg | ORAL_CAPSULE | Freq: Four times a day (QID) | ORAL | 0 refills | Status: DC

## 2017-02-14 MED ORDER — IOPAMIDOL (ISOVUE-300) INJECTION 61%
75.0000 mL | Freq: Once | INTRAVENOUS | Status: AC | PRN
  Administered 2017-02-14: 75 mL via INTRAVENOUS
  Filled 2017-02-14: qty 75

## 2017-02-14 MED ORDER — CLINDAMYCIN PHOSPHATE 600 MG/50ML IV SOLN
600.0000 mg | Freq: Once | INTRAVENOUS | Status: AC
  Administered 2017-02-14: 600 mg via INTRAVENOUS
  Filled 2017-02-14: qty 50

## 2017-02-14 MED ORDER — POTASSIUM CHLORIDE CRYS ER 20 MEQ PO TBCR
40.0000 meq | EXTENDED_RELEASE_TABLET | Freq: Once | ORAL | Status: AC
  Administered 2017-02-14: 40 meq via ORAL

## 2017-02-14 MED ORDER — POTASSIUM CHLORIDE CRYS ER 20 MEQ PO TBCR
EXTENDED_RELEASE_TABLET | ORAL | Status: AC
  Administered 2017-02-14: 40 meq via ORAL
  Filled 2017-02-14: qty 2

## 2017-02-14 MED ORDER — FENTANYL CITRATE (PF) 100 MCG/2ML IJ SOLN
75.0000 ug | Freq: Once | INTRAMUSCULAR | Status: AC
  Administered 2017-02-14: 75 ug via INTRAVENOUS
  Filled 2017-02-14: qty 2

## 2017-02-14 MED ORDER — FENTANYL CITRATE (PF) 100 MCG/2ML IJ SOLN
50.0000 ug | Freq: Once | INTRAMUSCULAR | Status: AC
  Administered 2017-02-14: 50 ug via INTRAVENOUS
  Filled 2017-02-14: qty 2

## 2017-02-14 MED ORDER — LIDOCAINE HCL (PF) 1 % IJ SOLN
10.0000 mL | Freq: Once | INTRAMUSCULAR | Status: AC
  Administered 2017-02-14: 10 mL
  Filled 2017-02-14: qty 10

## 2017-02-14 NOTE — ED Triage Notes (Signed)
Patient presents to the ED with redness, swelling and puss from a piercing site to her right breast.  Patient states she first noticed issues approx. 2 weeks ago but it has since gotten worse.  Patient states she removed the piercing last night and noticed puss coming from the site.  Patient denies fever.  Patient appears uncomfortable in triage.
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
## 2017-02-14 NOTE — ED Notes (Signed)
Pt calling out for more pain meds, provider notified

## 2017-02-14 NOTE — ED Notes (Signed)
Gave pt 4x4 gauze.

## 2017-02-14 NOTE — ED Notes (Signed)
Pt discharged to home.  Family member driving.  Discharge instructions reviewed.  Verbalized understanding.  No questions or concerns at this time.  Teach back verified.  Pt in NAD.  No items left in ED.   

## 2017-02-14 NOTE — ED Notes (Signed)
See triage note   States she has intermittent infection to right breast since having the breast pierced  Now states it is more swollen and draining today
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
## 2017-02-18 DIAGNOSIS — L539 Erythematous condition, unspecified: Secondary | ICD-10-CM | POA: Diagnosis not present

## 2017-02-18 DIAGNOSIS — N644 Mastodynia: Secondary | ICD-10-CM | POA: Diagnosis not present

## 2017-02-18 DIAGNOSIS — N611 Abscess of the breast and nipple: Secondary | ICD-10-CM | POA: Diagnosis not present

## 2017-02-18 DIAGNOSIS — R110 Nausea: Secondary | ICD-10-CM | POA: Diagnosis not present

## 2017-02-18 DIAGNOSIS — R6883 Chills (without fever): Secondary | ICD-10-CM | POA: Diagnosis not present

## 2017-02-20 DIAGNOSIS — Z87828 Personal history of other (healed) physical injury and trauma: Secondary | ICD-10-CM | POA: Diagnosis not present

## 2017-02-20 DIAGNOSIS — F419 Anxiety disorder, unspecified: Secondary | ICD-10-CM | POA: Diagnosis not present

## 2017-02-20 DIAGNOSIS — Z87891 Personal history of nicotine dependence: Secondary | ICD-10-CM | POA: Diagnosis not present

## 2017-02-20 DIAGNOSIS — N611 Abscess of the breast and nipple: Secondary | ICD-10-CM | POA: Diagnosis not present

## 2017-02-22 DIAGNOSIS — Z8049 Family history of malignant neoplasm of other genital organs: Secondary | ICD-10-CM | POA: Diagnosis not present

## 2017-02-22 DIAGNOSIS — F419 Anxiety disorder, unspecified: Secondary | ICD-10-CM | POA: Diagnosis not present

## 2017-02-22 DIAGNOSIS — N611 Abscess of the breast and nipple: Secondary | ICD-10-CM | POA: Diagnosis not present

## 2017-02-22 DIAGNOSIS — Z87891 Personal history of nicotine dependence: Secondary | ICD-10-CM | POA: Diagnosis not present

## 2017-02-23 DIAGNOSIS — Z4801 Encounter for change or removal of surgical wound dressing: Secondary | ICD-10-CM | POA: Diagnosis not present

## 2017-02-23 DIAGNOSIS — N611 Abscess of the breast and nipple: Secondary | ICD-10-CM | POA: Diagnosis not present

## 2017-02-23 DIAGNOSIS — Z792 Long term (current) use of antibiotics: Secondary | ICD-10-CM | POA: Diagnosis not present

## 2017-02-24 DIAGNOSIS — N611 Abscess of the breast and nipple: Secondary | ICD-10-CM | POA: Diagnosis not present

## 2017-02-24 DIAGNOSIS — Z792 Long term (current) use of antibiotics: Secondary | ICD-10-CM | POA: Diagnosis not present

## 2017-02-24 DIAGNOSIS — Z4801 Encounter for change or removal of surgical wound dressing: Secondary | ICD-10-CM | POA: Diagnosis not present

## 2017-03-06 DIAGNOSIS — N611 Abscess of the breast and nipple: Secondary | ICD-10-CM | POA: Diagnosis not present

## 2017-03-28 DIAGNOSIS — F902 Attention-deficit hyperactivity disorder, combined type: Secondary | ICD-10-CM | POA: Diagnosis not present

## 2017-06-27 DIAGNOSIS — F902 Attention-deficit hyperactivity disorder, combined type: Secondary | ICD-10-CM | POA: Diagnosis not present

## 2017-10-27 DIAGNOSIS — F902 Attention-deficit hyperactivity disorder, combined type: Secondary | ICD-10-CM | POA: Diagnosis not present

## 2017-12-20 DIAGNOSIS — Z5181 Encounter for therapeutic drug level monitoring: Secondary | ICD-10-CM | POA: Diagnosis not present

## 2017-12-26 DIAGNOSIS — Z79899 Other long term (current) drug therapy: Secondary | ICD-10-CM | POA: Diagnosis not present

## 2017-12-26 DIAGNOSIS — Z5181 Encounter for therapeutic drug level monitoring: Secondary | ICD-10-CM | POA: Diagnosis not present

## 2017-12-28 DIAGNOSIS — Z79899 Other long term (current) drug therapy: Secondary | ICD-10-CM | POA: Diagnosis not present

## 2017-12-28 DIAGNOSIS — Z5181 Encounter for therapeutic drug level monitoring: Secondary | ICD-10-CM | POA: Diagnosis not present

## 2018-01-01 DIAGNOSIS — Z79899 Other long term (current) drug therapy: Secondary | ICD-10-CM | POA: Diagnosis not present

## 2018-01-01 DIAGNOSIS — Z5181 Encounter for therapeutic drug level monitoring: Secondary | ICD-10-CM | POA: Diagnosis not present

## 2018-01-03 DIAGNOSIS — Z5181 Encounter for therapeutic drug level monitoring: Secondary | ICD-10-CM | POA: Diagnosis not present

## 2018-01-03 DIAGNOSIS — Z79899 Other long term (current) drug therapy: Secondary | ICD-10-CM | POA: Diagnosis not present

## 2018-01-08 DIAGNOSIS — Z5181 Encounter for therapeutic drug level monitoring: Secondary | ICD-10-CM | POA: Diagnosis not present

## 2018-01-08 DIAGNOSIS — Z79899 Other long term (current) drug therapy: Secondary | ICD-10-CM | POA: Diagnosis not present

## 2018-01-10 DIAGNOSIS — Z5181 Encounter for therapeutic drug level monitoring: Secondary | ICD-10-CM | POA: Diagnosis not present

## 2018-01-10 DIAGNOSIS — Z79899 Other long term (current) drug therapy: Secondary | ICD-10-CM | POA: Diagnosis not present

## 2018-01-15 DIAGNOSIS — Z5181 Encounter for therapeutic drug level monitoring: Secondary | ICD-10-CM | POA: Diagnosis not present

## 2018-01-15 DIAGNOSIS — Z79899 Other long term (current) drug therapy: Secondary | ICD-10-CM | POA: Diagnosis not present

## 2018-01-17 DIAGNOSIS — Z79899 Other long term (current) drug therapy: Secondary | ICD-10-CM | POA: Diagnosis not present

## 2018-01-17 DIAGNOSIS — Z5181 Encounter for therapeutic drug level monitoring: Secondary | ICD-10-CM | POA: Diagnosis not present

## 2018-01-24 DIAGNOSIS — F902 Attention-deficit hyperactivity disorder, combined type: Secondary | ICD-10-CM | POA: Diagnosis not present

## 2018-01-31 DIAGNOSIS — Z79899 Other long term (current) drug therapy: Secondary | ICD-10-CM | POA: Diagnosis not present

## 2018-01-31 DIAGNOSIS — Z5181 Encounter for therapeutic drug level monitoring: Secondary | ICD-10-CM | POA: Diagnosis not present

## 2018-02-12 DIAGNOSIS — Z79899 Other long term (current) drug therapy: Secondary | ICD-10-CM | POA: Diagnosis not present

## 2018-02-12 DIAGNOSIS — Z5181 Encounter for therapeutic drug level monitoring: Secondary | ICD-10-CM | POA: Diagnosis not present

## 2018-02-14 DIAGNOSIS — Z79899 Other long term (current) drug therapy: Secondary | ICD-10-CM | POA: Diagnosis not present

## 2018-02-14 DIAGNOSIS — Z5181 Encounter for therapeutic drug level monitoring: Secondary | ICD-10-CM | POA: Diagnosis not present

## 2018-02-20 DIAGNOSIS — Z5181 Encounter for therapeutic drug level monitoring: Secondary | ICD-10-CM | POA: Diagnosis not present

## 2018-02-20 DIAGNOSIS — Z79899 Other long term (current) drug therapy: Secondary | ICD-10-CM | POA: Diagnosis not present

## 2018-02-22 DIAGNOSIS — Z79899 Other long term (current) drug therapy: Secondary | ICD-10-CM | POA: Diagnosis not present

## 2018-02-22 DIAGNOSIS — Z5181 Encounter for therapeutic drug level monitoring: Secondary | ICD-10-CM | POA: Diagnosis not present

## 2018-02-26 DIAGNOSIS — Z5181 Encounter for therapeutic drug level monitoring: Secondary | ICD-10-CM | POA: Diagnosis not present

## 2018-02-26 DIAGNOSIS — Z79899 Other long term (current) drug therapy: Secondary | ICD-10-CM | POA: Diagnosis not present

## 2018-02-28 DIAGNOSIS — Z5181 Encounter for therapeutic drug level monitoring: Secondary | ICD-10-CM | POA: Diagnosis not present

## 2018-02-28 DIAGNOSIS — Z79899 Other long term (current) drug therapy: Secondary | ICD-10-CM | POA: Diagnosis not present

## 2018-03-05 DIAGNOSIS — Z79899 Other long term (current) drug therapy: Secondary | ICD-10-CM | POA: Diagnosis not present

## 2018-03-05 DIAGNOSIS — Z5181 Encounter for therapeutic drug level monitoring: Secondary | ICD-10-CM | POA: Diagnosis not present

## 2018-03-07 DIAGNOSIS — Z79899 Other long term (current) drug therapy: Secondary | ICD-10-CM | POA: Diagnosis not present

## 2018-03-07 DIAGNOSIS — Z5181 Encounter for therapeutic drug level monitoring: Secondary | ICD-10-CM | POA: Diagnosis not present

## 2018-03-12 DIAGNOSIS — Z5181 Encounter for therapeutic drug level monitoring: Secondary | ICD-10-CM | POA: Diagnosis not present

## 2018-03-12 DIAGNOSIS — Z79899 Other long term (current) drug therapy: Secondary | ICD-10-CM | POA: Diagnosis not present

## 2018-03-14 DIAGNOSIS — Z79899 Other long term (current) drug therapy: Secondary | ICD-10-CM | POA: Diagnosis not present

## 2018-03-14 DIAGNOSIS — Z5181 Encounter for therapeutic drug level monitoring: Secondary | ICD-10-CM | POA: Diagnosis not present

## 2018-03-20 DIAGNOSIS — Z79899 Other long term (current) drug therapy: Secondary | ICD-10-CM | POA: Diagnosis not present

## 2018-03-20 DIAGNOSIS — Z5181 Encounter for therapeutic drug level monitoring: Secondary | ICD-10-CM | POA: Diagnosis not present

## 2018-03-27 DIAGNOSIS — Z5181 Encounter for therapeutic drug level monitoring: Secondary | ICD-10-CM | POA: Diagnosis not present

## 2018-03-27 DIAGNOSIS — Z79899 Other long term (current) drug therapy: Secondary | ICD-10-CM | POA: Diagnosis not present

## 2018-03-29 DIAGNOSIS — Z5181 Encounter for therapeutic drug level monitoring: Secondary | ICD-10-CM | POA: Diagnosis not present

## 2018-03-29 DIAGNOSIS — Z79899 Other long term (current) drug therapy: Secondary | ICD-10-CM | POA: Diagnosis not present

## 2018-04-02 DIAGNOSIS — Z5181 Encounter for therapeutic drug level monitoring: Secondary | ICD-10-CM | POA: Diagnosis not present

## 2018-04-02 DIAGNOSIS — Z79899 Other long term (current) drug therapy: Secondary | ICD-10-CM | POA: Diagnosis not present

## 2018-04-04 DIAGNOSIS — Z79899 Other long term (current) drug therapy: Secondary | ICD-10-CM | POA: Diagnosis not present

## 2018-04-04 DIAGNOSIS — Z5181 Encounter for therapeutic drug level monitoring: Secondary | ICD-10-CM | POA: Diagnosis not present

## 2018-04-09 DIAGNOSIS — Z79899 Other long term (current) drug therapy: Secondary | ICD-10-CM | POA: Diagnosis not present

## 2018-04-09 DIAGNOSIS — Z5181 Encounter for therapeutic drug level monitoring: Secondary | ICD-10-CM | POA: Diagnosis not present

## 2018-04-11 DIAGNOSIS — Z79899 Other long term (current) drug therapy: Secondary | ICD-10-CM | POA: Diagnosis not present

## 2018-04-11 DIAGNOSIS — Z5181 Encounter for therapeutic drug level monitoring: Secondary | ICD-10-CM | POA: Diagnosis not present

## 2018-04-17 DIAGNOSIS — Z5181 Encounter for therapeutic drug level monitoring: Secondary | ICD-10-CM | POA: Diagnosis not present

## 2018-04-17 DIAGNOSIS — Z79899 Other long term (current) drug therapy: Secondary | ICD-10-CM | POA: Diagnosis not present

## 2018-04-18 DIAGNOSIS — F902 Attention-deficit hyperactivity disorder, combined type: Secondary | ICD-10-CM | POA: Diagnosis not present

## 2018-04-19 DIAGNOSIS — Z79899 Other long term (current) drug therapy: Secondary | ICD-10-CM | POA: Diagnosis not present

## 2018-04-19 DIAGNOSIS — Z5181 Encounter for therapeutic drug level monitoring: Secondary | ICD-10-CM | POA: Diagnosis not present

## 2018-04-26 DIAGNOSIS — Z79899 Other long term (current) drug therapy: Secondary | ICD-10-CM | POA: Diagnosis not present

## 2018-04-26 DIAGNOSIS — Z5181 Encounter for therapeutic drug level monitoring: Secondary | ICD-10-CM | POA: Diagnosis not present

## 2018-05-23 DIAGNOSIS — Z5181 Encounter for therapeutic drug level monitoring: Secondary | ICD-10-CM | POA: Diagnosis not present

## 2018-05-23 DIAGNOSIS — Z79899 Other long term (current) drug therapy: Secondary | ICD-10-CM | POA: Diagnosis not present

## 2018-05-25 DIAGNOSIS — Z79899 Other long term (current) drug therapy: Secondary | ICD-10-CM | POA: Diagnosis not present

## 2018-05-25 DIAGNOSIS — Z5181 Encounter for therapeutic drug level monitoring: Secondary | ICD-10-CM | POA: Diagnosis not present

## 2018-05-28 DIAGNOSIS — Z5181 Encounter for therapeutic drug level monitoring: Secondary | ICD-10-CM | POA: Diagnosis not present

## 2018-05-28 DIAGNOSIS — Z79899 Other long term (current) drug therapy: Secondary | ICD-10-CM | POA: Diagnosis not present

## 2018-05-30 DIAGNOSIS — Z79899 Other long term (current) drug therapy: Secondary | ICD-10-CM | POA: Diagnosis not present

## 2018-05-30 DIAGNOSIS — Z5181 Encounter for therapeutic drug level monitoring: Secondary | ICD-10-CM | POA: Diagnosis not present

## 2018-06-05 DIAGNOSIS — Z5181 Encounter for therapeutic drug level monitoring: Secondary | ICD-10-CM | POA: Diagnosis not present

## 2018-06-05 DIAGNOSIS — Z79899 Other long term (current) drug therapy: Secondary | ICD-10-CM | POA: Diagnosis not present

## 2018-06-07 DIAGNOSIS — Z79899 Other long term (current) drug therapy: Secondary | ICD-10-CM | POA: Diagnosis not present

## 2018-06-07 DIAGNOSIS — Z5181 Encounter for therapeutic drug level monitoring: Secondary | ICD-10-CM | POA: Diagnosis not present

## 2018-07-18 DIAGNOSIS — F902 Attention-deficit hyperactivity disorder, combined type: Secondary | ICD-10-CM | POA: Diagnosis not present

## 2018-10-17 DIAGNOSIS — F902 Attention-deficit hyperactivity disorder, combined type: Secondary | ICD-10-CM | POA: Diagnosis not present

## 2018-11-02 ENCOUNTER — Emergency Department
Admission: EM | Admit: 2018-11-02 | Discharge: 2018-11-02 | Disposition: A | Discharge status: Home / Self Care | Payer: Medicare Other | Attending: Emergency Medicine | Admitting: Emergency Medicine

## 2018-11-02 ENCOUNTER — Emergency Department: Payer: Medicare Other

## 2018-11-02 ENCOUNTER — Other Ambulatory Visit: Payer: Self-pay

## 2018-11-02 ENCOUNTER — Encounter: Payer: Self-pay | Admitting: Emergency Medicine

## 2018-11-02 DIAGNOSIS — B349 Viral infection, unspecified: Secondary | ICD-10-CM | POA: Insufficient documentation

## 2018-11-02 DIAGNOSIS — F17210 Nicotine dependence, cigarettes, uncomplicated: Secondary | ICD-10-CM | POA: Insufficient documentation

## 2018-11-02 DIAGNOSIS — R51 Headache: Secondary | ICD-10-CM | POA: Diagnosis not present

## 2018-11-02 DIAGNOSIS — Z3202 Encounter for pregnancy test, result negative: Secondary | ICD-10-CM | POA: Insufficient documentation

## 2018-11-02 DIAGNOSIS — M545 Low back pain: Secondary | ICD-10-CM | POA: Diagnosis not present

## 2018-11-02 DIAGNOSIS — Z79899 Other long term (current) drug therapy: Secondary | ICD-10-CM | POA: Diagnosis not present

## 2018-11-02 DIAGNOSIS — R0602 Shortness of breath: Secondary | ICD-10-CM | POA: Diagnosis not present

## 2018-11-02 DIAGNOSIS — R509 Fever, unspecified: Secondary | ICD-10-CM

## 2018-11-02 LAB — COMPREHENSIVE METABOLIC PANEL
ALBUMIN: 4.3 g/dL (ref 3.5–5.0)
ALK PHOS: 66 U/L (ref 38–126)
ALT: 18 U/L (ref 0–44)
ANION GAP: 12 (ref 5–15)
AST: 22 U/L (ref 15–41)
BILIRUBIN TOTAL: 0.5 mg/dL (ref 0.3–1.2)
BUN: 10 mg/dL (ref 6–20)
CHLORIDE: 102 mmol/L (ref 98–111)
CO2: 20 mmol/L — AB (ref 22–32)
CREATININE: 0.66 mg/dL (ref 0.44–1.00)
Calcium: 8.9 mg/dL (ref 8.9–10.3)
GFR calc Af Amer: >60 mL/min (ref >60)
GFR calc non Af Amer: >60 mL/min (ref >60)
Glucose, Bld: 118 mg/dL — ABNORMAL HIGH (ref 70–99)
POTASSIUM: 3.2 mmol/L — AB (ref 3.5–5.1)
Sodium: 134 mmol/L — ABNORMAL LOW (ref 135–145)
Total Protein: 7.4 g/dL (ref 6.5–8.1)

## 2018-11-02 LAB — URINALYSIS, COMPLETE (UACMP) WITH MICROSCOPIC
Bacteria, UA: NONE SEEN
Bilirubin Urine: NEGATIVE
GLUCOSE, UA: NEGATIVE mg/dL
Ketones, ur: NEGATIVE mg/dL
LEUKOCYTES UA: NEGATIVE
Nitrite: NEGATIVE
PH: 5 (ref 5.0–8.0)
Protein, ur: NEGATIVE mg/dL
Specific Gravity, Urine: 1.011 (ref 1.005–1.030)

## 2018-11-02 LAB — CBC WITH DIFFERENTIAL/PLATELET
Abs Immature Granulocytes: 0.25 10*3/uL — ABNORMAL HIGH (ref 0.00–0.07)
BASOS ABS: 0.1 10*3/uL (ref 0.0–0.1)
Basophils Relative: 0 %
EOS PCT: 0 %
Eosinophils Absolute: 0 10*3/uL (ref 0.0–0.5)
HCT: 39.8 % (ref 36.0–46.0)
HEMOGLOBIN: 14.2 g/dL (ref 12.0–15.0)
IMMATURE GRANULOCYTES: 1 %
LYMPHS PCT: 3 %
Lymphs Abs: 0.9 10*3/uL (ref 0.7–4.0)
MCH: 30 pg (ref 26.0–34.0)
MCHC: 35.7 g/dL (ref 30.0–36.0)
MCV: 84.1 fL (ref 80.0–100.0)
Monocytes Absolute: 1.4 10*3/uL — ABNORMAL HIGH (ref 0.1–1.0)
Monocytes Relative: 6 %
NEUTROS ABS: 22.4 10*3/uL — AB (ref 1.7–7.7)
NEUTROS PCT: 90 %
NRBC: 0 % (ref 0.0–0.2)
Platelets: 273 10*3/uL (ref 150–400)
RBC: 4.73 MIL/uL (ref 3.87–5.11)
RDW: 12.4 % (ref 11.5–15.5)
WBC: 25 10*3/uL — AB (ref 4.0–10.5)

## 2018-11-02 LAB — POCT PREGNANCY, URINE: PREG TEST UR: NEGATIVE

## 2018-11-02 LAB — PROTIME-INR
INR: 0.96
PROTHROMBIN TIME: 12.7 s (ref 11.4–15.2)

## 2018-11-02 LAB — INFLUENZA PANEL BY PCR (TYPE A & B)
Influenza A By PCR: NEGATIVE
Influenza B By PCR: NEGATIVE

## 2018-11-02 LAB — CG4 I-STAT (LACTIC ACID): LACTIC ACID, VENOUS: 1.75 mmol/L (ref 0.5–1.9)

## 2018-11-02 MED ORDER — ONDANSETRON HCL 4 MG/2ML IJ SOLN
4.0000 mg | Freq: Once | INTRAMUSCULAR | Status: AC
  Administered 2018-11-02: 4 mg via INTRAVENOUS
  Filled 2018-11-02: qty 2

## 2018-11-02 MED ORDER — SODIUM CHLORIDE 0.9 % IV BOLUS
1000.0000 mL | Freq: Once | INTRAVENOUS | Status: AC
  Administered 2018-11-02: 1000 mL via INTRAVENOUS

## 2018-11-02 MED ORDER — KETOROLAC TROMETHAMINE 30 MG/ML IJ SOLN
30.0000 mg | Freq: Once | INTRAMUSCULAR | Status: AC
  Administered 2018-11-02: 30 mg via INTRAVENOUS
  Filled 2018-11-02: qty 1

## 2018-11-02 MED ORDER — ACETAMINOPHEN 500 MG PO TABS
1000.0000 mg | ORAL_TABLET | Freq: Once | ORAL | Status: AC
  Administered 2018-11-02: 1000 mg via ORAL
  Filled 2018-11-02: qty 2

## 2018-11-02 NOTE — ED Notes (Signed)
MD Derrill KayGoodman made aware of pt's VS and orders given to this RN.

## 2018-11-02 NOTE — ED Notes (Signed)
Dr. Siadecki at bedside 

## 2018-11-02 NOTE — ED Notes (Signed)
Pt reports headache today, reports at work felt jittery, reports was not able to stay warm, reports generalized aches, mild cough " I'm a smoker" no change in cough. Pt talks in complete sentences no distress noted

## 2018-11-02 NOTE — ED Notes (Signed)
Pt verbalizes understanding of discharge instructions.

## 2018-11-02 NOTE — ED Triage Notes (Signed)
Pt arrives ambulatory to triage with c/o fever since this morning with back spasms at this time. Pt is tachycardic at this time in triage and experiencing a severe HA. Pt is flushed at this time in triage and tearful.
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
## 2018-11-02 NOTE — Discharge Instructions (Addendum)
You can take ibuprofen (Motrin, Advil) 600 mg up to every 6 hours, and Tylenol 500 or 650 mg every 4 hours for pain or fever.  If you alternate Tylenol and ibuprofen you can take something every 3 hours.  Return to the ER for new, worsening, persistent severe fever, pain, headache, vomiting, shortness of breath, or any other new or worsening symptoms that concern you.

## 2018-11-05 DIAGNOSIS — R5383 Other fatigue: Secondary | ICD-10-CM | POA: Diagnosis not present

## 2018-11-05 DIAGNOSIS — R110 Nausea: Secondary | ICD-10-CM | POA: Diagnosis not present

## 2018-11-05 DIAGNOSIS — R509 Fever, unspecified: Secondary | ICD-10-CM | POA: Diagnosis not present

## 2018-11-05 DIAGNOSIS — R05 Cough: Secondary | ICD-10-CM | POA: Diagnosis not present

## 2018-11-05 DIAGNOSIS — J029 Acute pharyngitis, unspecified: Secondary | ICD-10-CM | POA: Diagnosis not present

## 2018-11-05 DIAGNOSIS — R21 Rash and other nonspecific skin eruption: Secondary | ICD-10-CM | POA: Diagnosis not present

## 2018-11-07 LAB — CULTURE, BLOOD (ROUTINE X 2)
CULTURE: NO GROWTH
SPECIAL REQUESTS: ADEQUATE

## 2018-11-13 ENCOUNTER — Ambulatory Visit (INDEPENDENT_AMBULATORY_CARE_PROVIDER_SITE_OTHER): Payer: Medicare Other | Admitting: Internal Medicine

## 2018-11-13 ENCOUNTER — Encounter: Payer: Self-pay | Admitting: Internal Medicine

## 2018-11-13 DIAGNOSIS — R51 Headache: Secondary | ICD-10-CM | POA: Diagnosis not present

## 2018-11-13 DIAGNOSIS — F988 Other specified behavioral and emotional disorders with onset usually occurring in childhood and adolescence: Secondary | ICD-10-CM

## 2018-11-13 DIAGNOSIS — F5104 Psychophysiologic insomnia: Secondary | ICD-10-CM | POA: Diagnosis not present

## 2018-11-13 DIAGNOSIS — G4700 Insomnia, unspecified: Secondary | ICD-10-CM | POA: Insufficient documentation

## 2018-11-13 DIAGNOSIS — F419 Anxiety disorder, unspecified: Secondary | ICD-10-CM

## 2018-11-13 DIAGNOSIS — F329 Major depressive disorder, single episode, unspecified: Secondary | ICD-10-CM

## 2018-11-13 DIAGNOSIS — R519 Headache, unspecified: Secondary | ICD-10-CM

## 2018-11-13 NOTE — Assessment & Plan Note (Signed)
Chronic but stable on scheduled Xanax She will continue to follow with neuropsych

## 2018-11-13 NOTE — Assessment & Plan Note (Signed)
Chronic but stable on Ambien She will continue to follow with neuropsych

## 2018-11-13 NOTE — Assessment & Plan Note (Signed)
Controlled on Adderall She will continue to follow with neuropsych

## 2018-11-13 NOTE — Patient Instructions (Signed)
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
## 2018-12-13 ENCOUNTER — Encounter: Payer: Self-pay | Admitting: Internal Medicine

## 2018-12-13 ENCOUNTER — Other Ambulatory Visit (HOSPITAL_COMMUNITY)
Admission: RE | Admit: 2018-12-13 | Discharge: 2018-12-13 | Disposition: A | Discharge status: Home / Self Care | Payer: Medicare Other | Source: Ambulatory Visit | Attending: Internal Medicine | Admitting: Internal Medicine

## 2018-12-13 ENCOUNTER — Ambulatory Visit (INDEPENDENT_AMBULATORY_CARE_PROVIDER_SITE_OTHER): Payer: Medicare Other | Admitting: Internal Medicine

## 2018-12-13 VITALS — BP 122/80 | HR 80 | Temp 98.4°F | Ht 64.0 in | Wt 139.0 lb

## 2018-12-13 DIAGNOSIS — Z0000 Encounter for general adult medical examination without abnormal findings: Secondary | ICD-10-CM | POA: Diagnosis not present

## 2018-12-13 DIAGNOSIS — Z124 Encounter for screening for malignant neoplasm of cervix: Secondary | ICD-10-CM | POA: Diagnosis not present

## 2018-12-13 DIAGNOSIS — F419 Anxiety disorder, unspecified: Secondary | ICD-10-CM | POA: Diagnosis not present

## 2018-12-13 DIAGNOSIS — F329 Major depressive disorder, single episode, unspecified: Secondary | ICD-10-CM | POA: Diagnosis not present

## 2018-12-13 DIAGNOSIS — Z114 Encounter for screening for human immunodeficiency virus [HIV]: Secondary | ICD-10-CM

## 2018-12-13 DIAGNOSIS — Z113 Encounter for screening for infections with a predominantly sexual mode of transmission: Secondary | ICD-10-CM | POA: Insufficient documentation

## 2018-12-13 DIAGNOSIS — E559 Vitamin D deficiency, unspecified: Secondary | ICD-10-CM

## 2018-12-13 LAB — CBC
HCT: 45.1 % (ref 36.0–46.0)
HEMOGLOBIN: 15.5 g/dL — AB (ref 12.0–15.0)
MCHC: 34.3 g/dL (ref 30.0–36.0)
MCV: 88.5 fl (ref 78.0–100.0)
Platelets: 346 10*3/uL (ref 150.0–400.0)
RBC: 5.09 Mil/uL (ref 3.87–5.11)
RDW: 14.1 % (ref 11.5–15.5)
WBC: 13 10*3/uL — AB (ref 4.0–10.5)

## 2018-12-13 LAB — LIPID PANEL
Cholesterol: 242 mg/dL — ABNORMAL HIGH (ref 0–200)
HDL: 88 mg/dL (ref >39.00)
LDL Cholesterol: 141 mg/dL — ABNORMAL HIGH (ref 0–99)
NonHDL: 154.01
TRIGLYCERIDES: 67 mg/dL (ref 0.0–149.0)
Total CHOL/HDL Ratio: 3
VLDL: 13.4 mg/dL (ref 0.0–40.0)

## 2018-12-13 LAB — COMPREHENSIVE METABOLIC PANEL
ALT: 10 U/L (ref 0–35)
AST: 16 U/L (ref 0–37)
Albumin: 4.5 g/dL (ref 3.5–5.2)
Alkaline Phosphatase: 68 U/L (ref 39–117)
BUN: 10 mg/dL (ref 6–23)
CO2: 29 mEq/L (ref 19–32)
Calcium: 9.9 mg/dL (ref 8.4–10.5)
Chloride: 103 mEq/L (ref 96–112)
Creatinine, Ser: 0.68 mg/dL (ref 0.40–1.20)
GFR: 122.2 mL/min (ref >60.00)
Glucose, Bld: 102 mg/dL — ABNORMAL HIGH (ref 70–99)
POTASSIUM: 3.6 meq/L (ref 3.5–5.1)
Sodium: 139 mEq/L (ref 135–145)
Total Bilirubin: 0.6 mg/dL (ref 0.2–1.2)
Total Protein: 7.6 g/dL (ref 6.0–8.3)

## 2018-12-13 LAB — VITAMIN D 25 HYDROXY (VIT D DEFICIENCY, FRACTURES): VITD: 18.61 ng/mL — ABNORMAL LOW (ref 30.00–100.00)

## 2018-12-13 NOTE — Progress Notes (Signed)
HPI:  Pt presents to the clinic today for her Medicare Wellness Exam.   Past Medical History:  Diagnosis Date  . ADD (attention deficit disorder)   . Amenorrhea   . Anxiety   . Depression   . Insomnia   . Malpositioned IUD   . Uterine perforation by intrauterine contraceptive device   . UTI (lower urinary tract infection)   . Vaginal Pap smear, abnormal     Current Outpatient Medications  Medication Sig Dispense Refill  . ALPRAZolam (XANAX) 1 MG tablet Take 1 mg by mouth 3 (three) times daily.    Marland Kitchen amphetamine-dextroamphetamine (ADDERALL) 10 MG tablet Take 10 mg by mouth 2 (two) times daily.    Marland Kitchen HYDROcodone-acetaminophen (NORCO/VICODIN) 5-325 MG tablet Take 2 tablets by mouth 3 (three) times daily.    Marland Kitchen zolpidem (AMBIEN) 10 MG tablet Take by mouth.     No current facility-administered medications for this visit.     Allergies  Allergen Reactions  . Shellfish Allergy     Family History  Problem Relation Age of Onset  . Breast cancer Maternal Aunt   . Ovarian cancer Maternal Aunt   . Stroke Mother   . Breast cancer Maternal Grandmother   . Breast cancer Maternal Aunt   . Diabetes Neg Hx   . Heart disease Neg Hx   . Colon cancer Neg Hx     Social History   Socioeconomic History  . Marital status: Married    Spouse name: Not on file  . Number of children: Not on file  . Years of education: Not on file  . Highest education level: Not on file  Occupational History  . Not on file  Social Needs  . Financial resource strain: Not on file  . Food insecurity:    Worry: Not on file    Inability: Not on file  . Transportation needs:    Medical: Not on file    Non-medical: Not on file  Tobacco Use  . Smoking status: Current Every Day Smoker    Packs/day: 1.00    Years: 15.00    Pack years: 15.00    Types: Cigarettes    Last attempt to quit: 12/17/2016    Years since quitting: 1.9  . Smokeless tobacco: Never Used  Substance and Sexual Activity  . Alcohol use:  Yes    Alcohol/week: 2.0 standard drinks    Types: 2 Shots of liquor per week    Comment: moderate  . Drug use: No  . Sexual activity: Yes    Birth control/protection: I.U.D.  Lifestyle  . Physical activity:    Days per week: Not on file    Minutes per session: Not on file  . Stress: Not on file  Relationships  . Social connections:    Talks on phone: Not on file    Gets together: Not on file    Attends religious service: Not on file    Active member of club or organization: Not on file    Attends meetings of clubs or organizations: Not on file    Relationship status: Not on file  . Intimate partner violence:    Fear of current or ex partner: Not on file    Emotionally abused: Not on file    Physically abused: Not on file    Forced sexual activity: Not on file  Other Topics Concern  . Not on file  Social History Narrative  . Not on file    Hospitiliaztions: ER visit 11/02/18  for febrile illness  Health Maintenance:    Flu: never  Tetanus: > 10 years  Mammogram: never  Pap Smear: > 5 years ago  Eye Doctor: as needed  Dental Exam: dentures   Providers:   PCP: Webb Silversmith, NP-C  Neuropsychiatry: [facility]   I have personally reviewed and have noted:  1. The patient's medical and social history 2. Their use of alcohol, tobacco or illicit drugs 3. Their current medications and supplements 4. The patient's functional ability including ADL's, fall risks, home safety risks and hearing or visual impairment. 5. Diet and physical activities 6. Evidence for depression or mood disorder  Subjective:   Review of Systems:   Constitutional: Patient reports frequent headaches.  Denies fever, malaise, fatigue, or abrupt weight changes.  HEENT: Denies eye pain, eye redness, ear pain, ringing in the ears, wax buildup, runny nose, nasal congestion, bloody nose, or sore throat. Respiratory: Denies difficulty breathing, shortness of breath, cough or sputum  production.   Cardiovascular: Denies chest pain, chest tightness, palpitations or swelling in the hands or feet.  Gastrointestinal: Denies abdominal pain, bloating, constipation, diarrhea or blood in the stool.  GU: Denies urgency, frequency, pain with urination, burning sensation, blood in urine, odor or discharge. Musculoskeletal: Denies decrease in range of motion, difficulty with gait, muscle pain or joint pain and swelling.  Skin: Denies redness, rashes, lesions or ulcercations.  Neurological: Patient reports difficulty with memory, insomnia.  Denies dizziness, difficulty with speech or problems with balance and coordination.  Psych: Patient has a history of anxiety and depression.  Denies SI/HI.  No other specific complaints in a complete review of systems (except as listed in HPI above).  Objective:  PE:   BP 122/80   Pulse 80   Temp 98.4 F (36.9 C) (Oral)   Ht '5\' 4"'$  (1.626 m)   Wt 139 lb (63 kg)   LMP 11/29/2018   BMI 23.86 kg/m   Wt Readings from Last 3 Encounters:  11/13/18 141 lb (64 kg)  11/02/18 135 lb (61.2 kg)  02/14/17 120 lb (54.4 kg)    General: Appears her stated age, well developed, well nourished in NAD. Skin: Warm, dry and intact.  HEENT: Head: normal shape and size; Eyes: sclera white, no icterus, conjunctiva pink, PERRLA and EOMs intact; Ears: Tm's gray and intact, normal light reflex; Throat/Mouth: Teeth present, mucosa pink and moist, no exudate, lesions or ulcerations noted.  Neck: Neck supple, trachea midline. No masses, lumps or thyromegaly present.  Cardiovascular: Normal rate and rhythm. S1,S2 noted.  No murmur, rubs or gallops noted. No JVD or BLE edema.  Pulmonary/Chest: Normal effort and positive vesicular breath sounds. No respiratory distress. No wheezes, rales or ronchi noted.  Abdomen: Soft and nontender. Normal bowel sounds. No distention or masses noted. Liver, spleen and kidneys non palpable. Pelvic: Normal female anatomy.  No discharge  noted in the vaginal vault.  Cervix without changes.  CMT.  Adnexa palpable on the left, nonpalpable right. Musculoskeletal: Normal range of motion. Strength 5/5 BUE/BLE. No difficulty with gait. Neurological: Alert and oriented. Cranial nerves II-XII grossly intact. Coordination normal.  Psychiatric: Mood and affect normal. Behavior is normal. Judgment and thought content normal.     BMET    Component Value Date/Time   NA 134 (L) 11/02/2018 2014   NA 136 12/26/2014 0742   K 3.2 (L) 11/02/2018 2014   K 4.4 12/26/2014 0742   CL 102 11/02/2018 2014   CL 107 12/26/2014 0742   CO2  20 (L) 11/02/2018 2014   CO2 24 12/26/2014 0742   GLUCOSE 118 (H) 11/02/2018 2014   GLUCOSE 92 12/26/2014 0742   BUN 10 11/02/2018 2014   BUN 2 (L) 12/26/2014 0742   CREATININE 0.66 11/02/2018 2014   CREATININE 0.65 12/26/2014 0742   CALCIUM 8.9 11/02/2018 2014   CALCIUM 8.7 12/26/2014 0742   GFRNONAA >60 11/02/2018 2014   GFRNONAA >60 12/26/2014 0742   GFRAA >60 11/02/2018 2014   GFRAA >60 12/26/2014 0742    Lipid Panel  No results found for: CHOL, TRIG, HDL, CHOLHDL, VLDL, LDLCALC  CBC    Component Value Date/Time   WBC 25.0 (H) 11/02/2018 2014   RBC 4.73 11/02/2018 2014   HGB 14.2 11/02/2018 2014   HGB 14.2 03/12/2015 1108   HCT 39.8 11/02/2018 2014   HCT 41.3 03/12/2015 1108   PLT 273 11/02/2018 2014   PLT 304 03/12/2015 1108   MCV 84.1 11/02/2018 2014   MCV 90 03/12/2015 1108   MCH 30.0 11/02/2018 2014   MCHC 35.7 11/02/2018 2014   RDW 12.4 11/02/2018 2014   RDW 12.8 03/12/2015 1108   LYMPHSABS 0.9 11/02/2018 2014   LYMPHSABS 3.1 12/26/2014 0742   MONOABS 1.4 (H) 11/02/2018 2014   MONOABS 0.9 12/26/2014 0742   EOSABS 0.0 11/02/2018 2014   EOSABS 0.2 12/26/2014 0742   BASOSABS 0.1 11/02/2018 2014   BASOSABS 0.1 12/26/2014 0742    Hgb A1C Lab Results  Component Value Date   HGBA1C 5.1 12/24/2014      Assessment and Plan:   Medicare Annual Wellness Visit:  Diet:  She  doe eat meat. She consumes fruits and veggies daily. She does eat fried foods. She drinks mostly gatorade, water, beer. Physical activity: None Depression/mood screen: Positive, chronic on meds Hearing: Intact to whispered voice Visual acuity: Grossly normal, performs annual eye exam  ADLs: Capable Fall risk: None Home safety: Good Cognitive evaluation: Intact to orientation, naming, recall and repetition EOL planning: No adv directives, full code/ I agree  Preventative Medicine: She declines flu shot today.  She declines tetanus for financial reasons.  Attempted to order mammogram but insurance would not cover for screening and family history of breast cancer.  She will call to see how much it will be out-of-pocket.  Pap smear today with STD screening.  Encouraged her to consume a balanced diet and exercise regimen.  Advised her to see an eye doctor and dentist annually.  Will check CBC, C met, lipid, vitamin D and HIV today  Next appointment: 1 year, Medicare wellness exam   Webb Silversmith, NP

## 2018-12-13 NOTE — Patient Instructions (Signed)

## 2018-12-14 LAB — HIV ANTIBODY (ROUTINE TESTING W REFLEX): HIV 1&2 Ab, 4th Generation: NONREACTIVE

## 2018-12-15 ENCOUNTER — Other Ambulatory Visit: Payer: Self-pay | Admitting: Internal Medicine

## 2018-12-15 DIAGNOSIS — E7800 Pure hypercholesterolemia, unspecified: Secondary | ICD-10-CM

## 2018-12-15 DIAGNOSIS — E559 Vitamin D deficiency, unspecified: Secondary | ICD-10-CM

## 2018-12-15 MED ORDER — VITAMIN D (ERGOCALCIFEROL) 1.25 MG (50000 UNIT) PO CAPS: 50000.0000 [IU] | ORAL_CAPSULE | ORAL | 0 refills | Status: DC

## 2018-12-19 ENCOUNTER — Encounter: Payer: Self-pay | Admitting: Internal Medicine

## 2018-12-19 LAB — CYTOLOGY - PAP
Bacterial vaginitis: POSITIVE — AB
Candida vaginitis: NEGATIVE
Chlamydia: NEGATIVE
Diagnosis: NEGATIVE
HPV (WINDOPATH): NOT DETECTED
Neisseria Gonorrhea: NEGATIVE
Trichomonas: NEGATIVE

## 2018-12-20 MED ORDER — ATORVASTATIN CALCIUM 10 MG PO TABS: 10.0000 mg | ORAL_TABLET | Freq: Every day | ORAL | 2 refills | Status: DC

## 2018-12-20 NOTE — Addendum Note (Signed)
Addended by: Roena Malady on: 12/20/2018 12:04 PM   Modules accepted: Orders

## 2018-12-20 NOTE — Addendum Note (Signed)
Addended by: Roena Malady on: 12/20/2018 12:02 PM   Modules accepted: Orders

## 2019-01-11 IMAGING — CT CT CHEST W/ CM
2 of 3 series · 15 of 36 positions shown, 18 images · IV contrast (iopamidol)
Comparison: None.

CLINICAL DATA: Right breast nipple draining abscess at previous
piercing site

EXAM:
CT CHEST WITH CONTRAST
TECHNIQUE: Multidetector CT imaging of the chest was performed during
intravenous contrast administration.
CONTRAST:  75mL [id number] IOPAMIDOL ([id number]) INJECTION 61%

[Series 2: axial st · axial · 0.68mm/px · z∈[-51,+221]mm · 12 of 160 slices shown, 15 images]
[im 12/160  mediastinal]
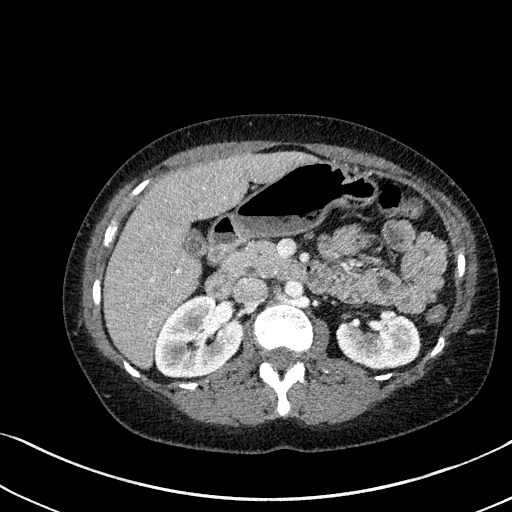
[im 12/160  lung]
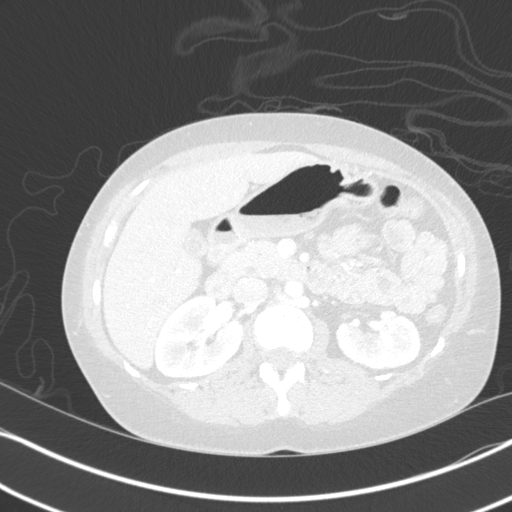
[im 24/160  lung]
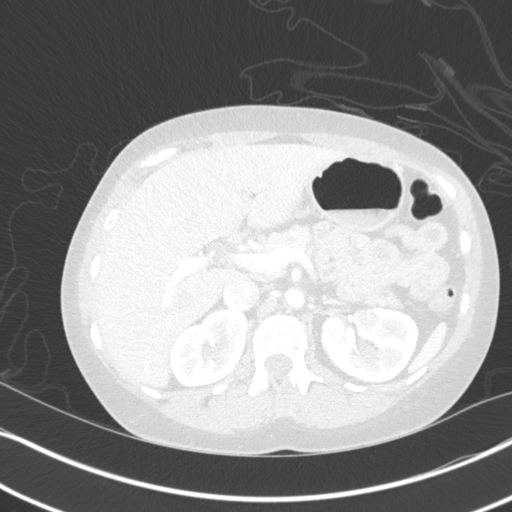
[im 36/160  lung]
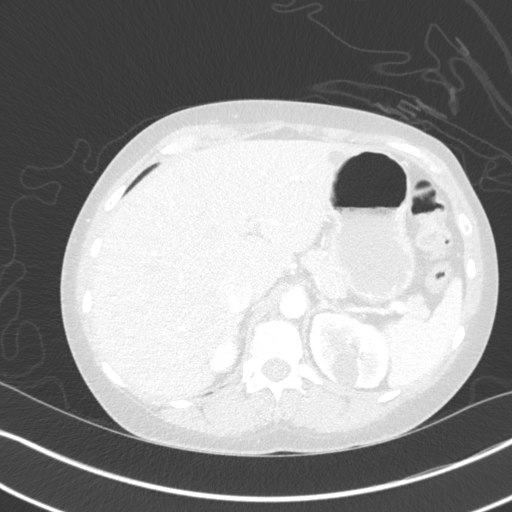
[im 48/160  lung]
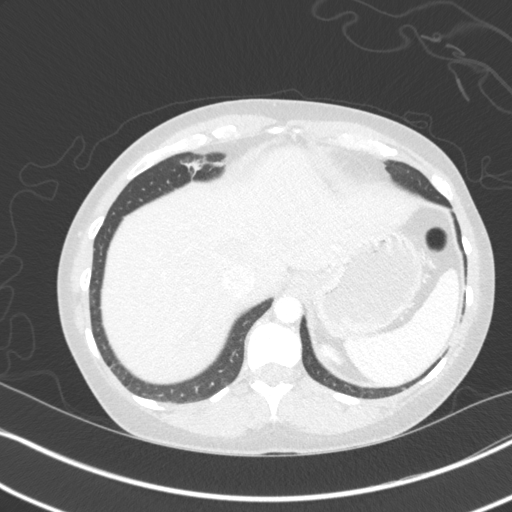
[im 59/160  mediastinal]
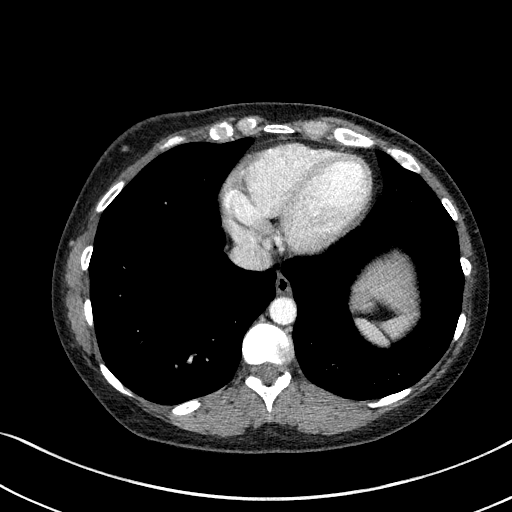
[im 59/160  lung]
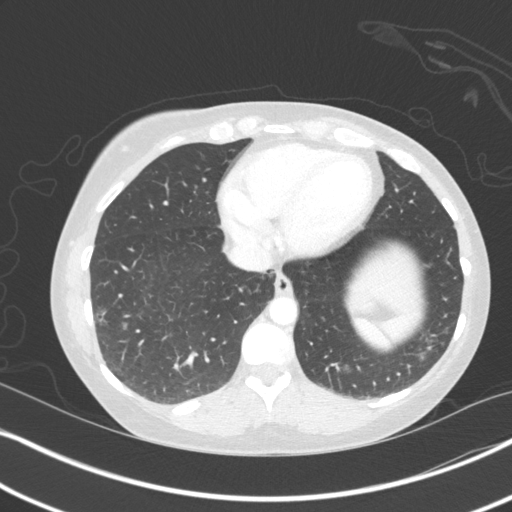
[im 71/160  lung]
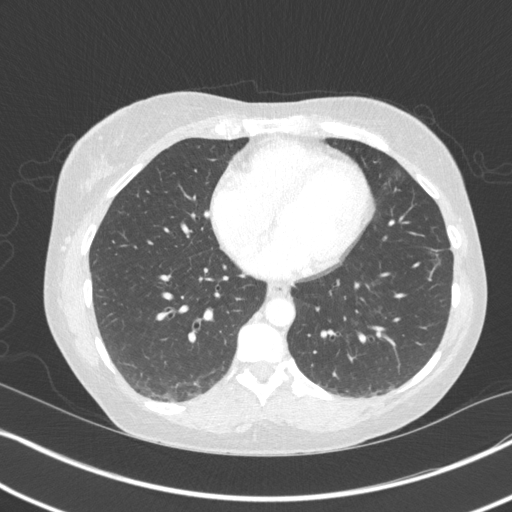
[im 89/160  lung]
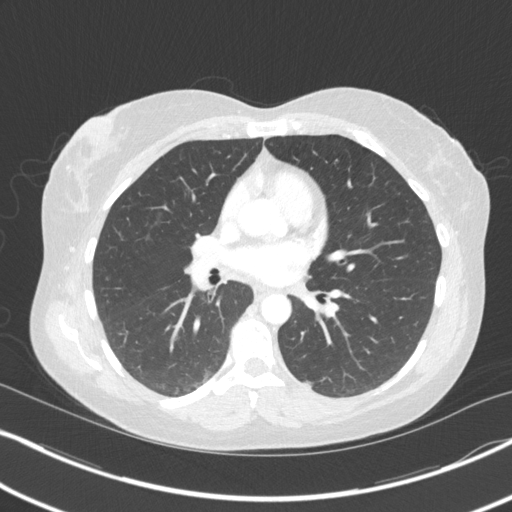
[im 101/160  lung]
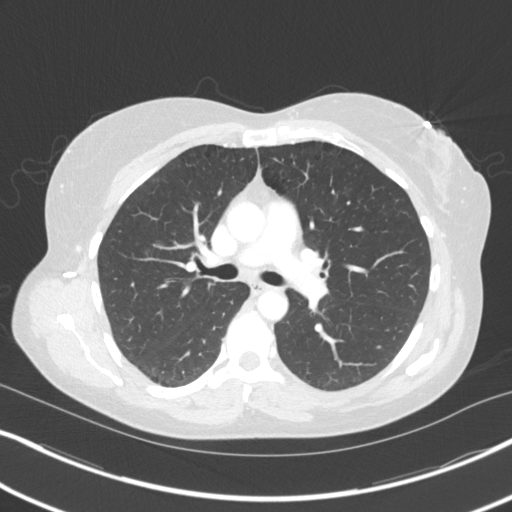
[im 112/160  mediastinal]
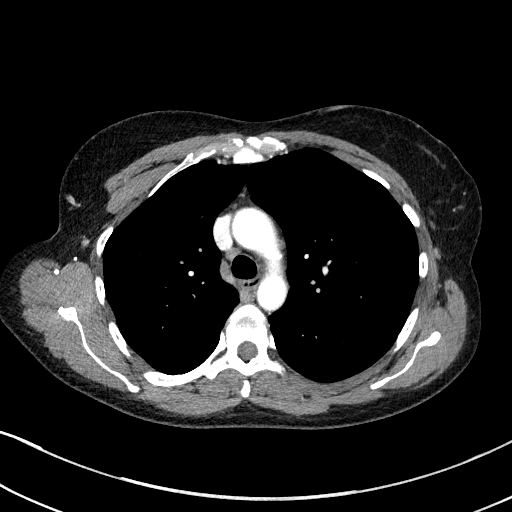
[im 112/160  lung]
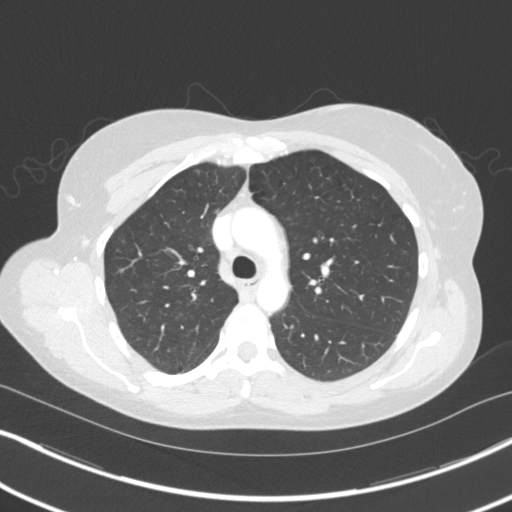
[im 124/160  lung]
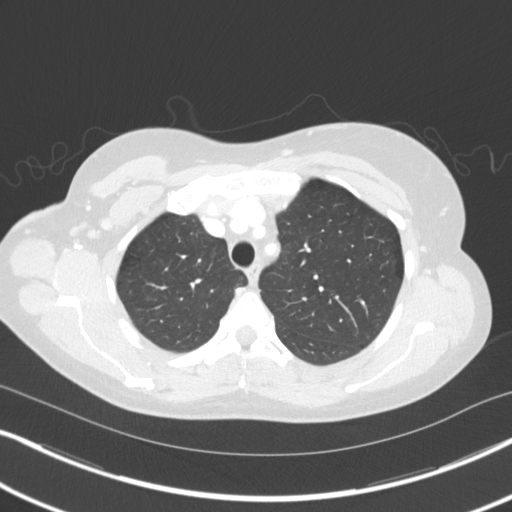
[im 136/160  lung]
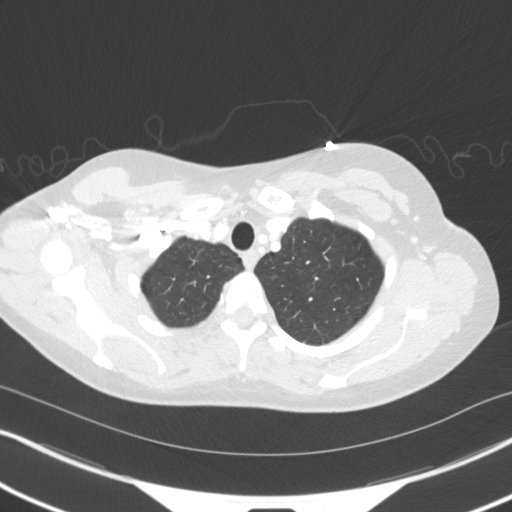
[im 148/160  lung]
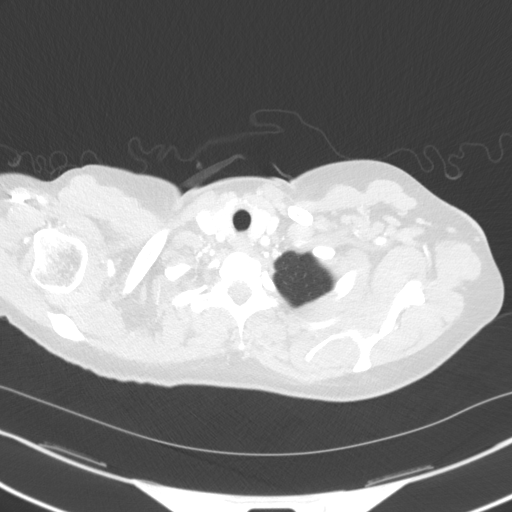

[Series 5: coronal · coronal · 0.66mm/px · 3 of 115 slices shown]
[im 23/115  lung]
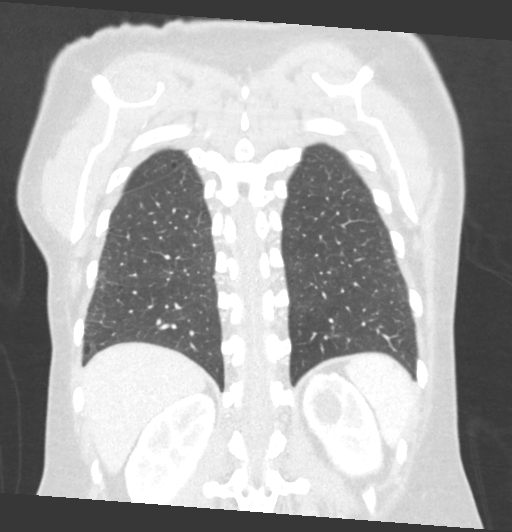
[im 46/115  lung]
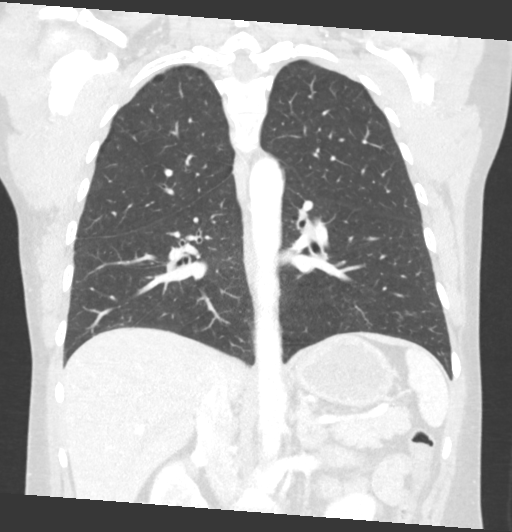
[im 69/115  lung]
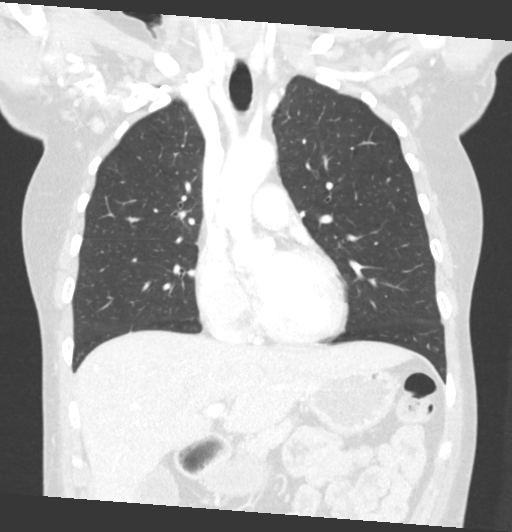

[15 of 36 positions shown; findings below may reference images not displayed]

FINDINGS: Cardiovascular: Minor atherosclerotic change of the major branch
vessels and the thoracic aortic arch. No aneurysm or dissection. No
mediastinal hemorrhage or hematoma. Normal heart size. No
pericardial effusion.

Mediastinum/Nodes: Nonenlarged axillary, mediastinal or hilar lymph
nodes. No definite adenopathy. Thyroid, trachea and esophagus
unremarkable.

Lungs/Pleura: Mild hyperinflation. Minor upper lobe emphysema and
small subpleural blebs. Tiny punctate subcentimeter pulmonary
nodules in the right upper lobe, image 63, and right lower lobe
images 83 and 84, in the left lower lobe image 78. These small
pulmonary nodules all measure 5 mm or less in size. Scattered areas
of parenchymal scarring atelectasis. No superimposed pneumonia,
edema, interstitial process. Trachea and central airways are patent.
No pleural effusion or pneumothorax.

Upper Abdomen: Incidental hepatic and renal cysts. Retroaortic left
renal vein noted, normal variant. Abdominal aortic atherosclerosis
present. No biliary dilatation.

Musculoskeletal: Right breast nipple demonstrates skin thickening
and sub areolar nodular mass with central low attenuation component
measuring 1.3 x 1.1 cm. This is compatible with a right breast
nipple small abscess. Left nipple piercing also noted. No acute
osseous finding. Sternum intact.
IMPRESSION: Right breast nipple skin thickening and small nodular mass with a
central low-attenuation area measuring 13 x 11 mm compatible with
small nipple abscess.

Small bilateral scattered pulmonary nodules all measuring 5 mm or
less in size.

No follow-up needed if patient is low-risk. Non-contrast chest CT
can be considered in 12 months if patient is high-risk. This
recommendation follows the consensus statement: Guidelines for
Management of Incidental Pulmonary Nodules Detected on CT Images:

## 2019-01-17 DIAGNOSIS — F902 Attention-deficit hyperactivity disorder, combined type: Secondary | ICD-10-CM | POA: Diagnosis not present

## 2019-03-26 ENCOUNTER — Other Ambulatory Visit: Payer: Self-pay | Admitting: Internal Medicine

## 2019-04-11 DIAGNOSIS — F902 Attention-deficit hyperactivity disorder, combined type: Secondary | ICD-10-CM | POA: Diagnosis not present

## 2019-04-27 ENCOUNTER — Other Ambulatory Visit: Payer: Self-pay | Admitting: Internal Medicine

## 2019-04-30 ENCOUNTER — Other Ambulatory Visit: Payer: Self-pay | Admitting: Internal Medicine

## 2019-07-11 DIAGNOSIS — F902 Attention-deficit hyperactivity disorder, combined type: Secondary | ICD-10-CM | POA: Diagnosis not present

## 2019-08-23 ENCOUNTER — Encounter: Payer: Self-pay | Admitting: Internal Medicine

## 2019-10-11 DIAGNOSIS — F902 Attention-deficit hyperactivity disorder, combined type: Secondary | ICD-10-CM | POA: Diagnosis not present

## 2019-12-24 ENCOUNTER — Ambulatory Visit: Payer: Medicare Other | Attending: Internal Medicine

## 2019-12-31 DIAGNOSIS — F902 Attention-deficit hyperactivity disorder, combined type: Secondary | ICD-10-CM | POA: Diagnosis not present

## 2020-01-30 DIAGNOSIS — F419 Anxiety disorder, unspecified: Secondary | ICD-10-CM | POA: Diagnosis not present

## 2020-01-30 DIAGNOSIS — H9202 Otalgia, left ear: Secondary | ICD-10-CM | POA: Diagnosis not present

## 2020-01-30 DIAGNOSIS — R1030 Lower abdominal pain, unspecified: Secondary | ICD-10-CM | POA: Diagnosis not present

## 2020-03-31 DIAGNOSIS — I889 Nonspecific lymphadenitis, unspecified: Secondary | ICD-10-CM | POA: Diagnosis not present

## 2020-03-31 DIAGNOSIS — H9202 Otalgia, left ear: Secondary | ICD-10-CM | POA: Diagnosis not present

## 2020-03-31 DIAGNOSIS — F902 Attention-deficit hyperactivity disorder, combined type: Secondary | ICD-10-CM | POA: Diagnosis not present

## 2020-06-30 DIAGNOSIS — F902 Attention-deficit hyperactivity disorder, combined type: Secondary | ICD-10-CM | POA: Diagnosis not present

## 2020-07-28 DIAGNOSIS — Z20822 Contact with and (suspected) exposure to covid-19: Secondary | ICD-10-CM | POA: Diagnosis not present

## 2020-07-28 DIAGNOSIS — Z03818 Encounter for observation for suspected exposure to other biological agents ruled out: Secondary | ICD-10-CM | POA: Diagnosis not present

## 2020-07-28 DIAGNOSIS — U071 COVID-19: Secondary | ICD-10-CM | POA: Diagnosis not present

## 2020-07-31 DIAGNOSIS — Z20822 Contact with and (suspected) exposure to covid-19: Secondary | ICD-10-CM | POA: Diagnosis not present

## 2020-07-31 DIAGNOSIS — U071 COVID-19: Secondary | ICD-10-CM | POA: Diagnosis not present

## 2020-09-28 DIAGNOSIS — F902 Attention-deficit hyperactivity disorder, combined type: Secondary | ICD-10-CM | POA: Diagnosis not present

## 2020-09-28 IMAGING — CR DG CHEST 2V
2 series · 2 of 2 positions shown · non-contrast
Comparison: 02/14/2017 CT chest

CLINICAL DATA: 42 y/o  F; fever and back spasms.

EXAM:
CHEST - 2 VIEW

[chest pa]
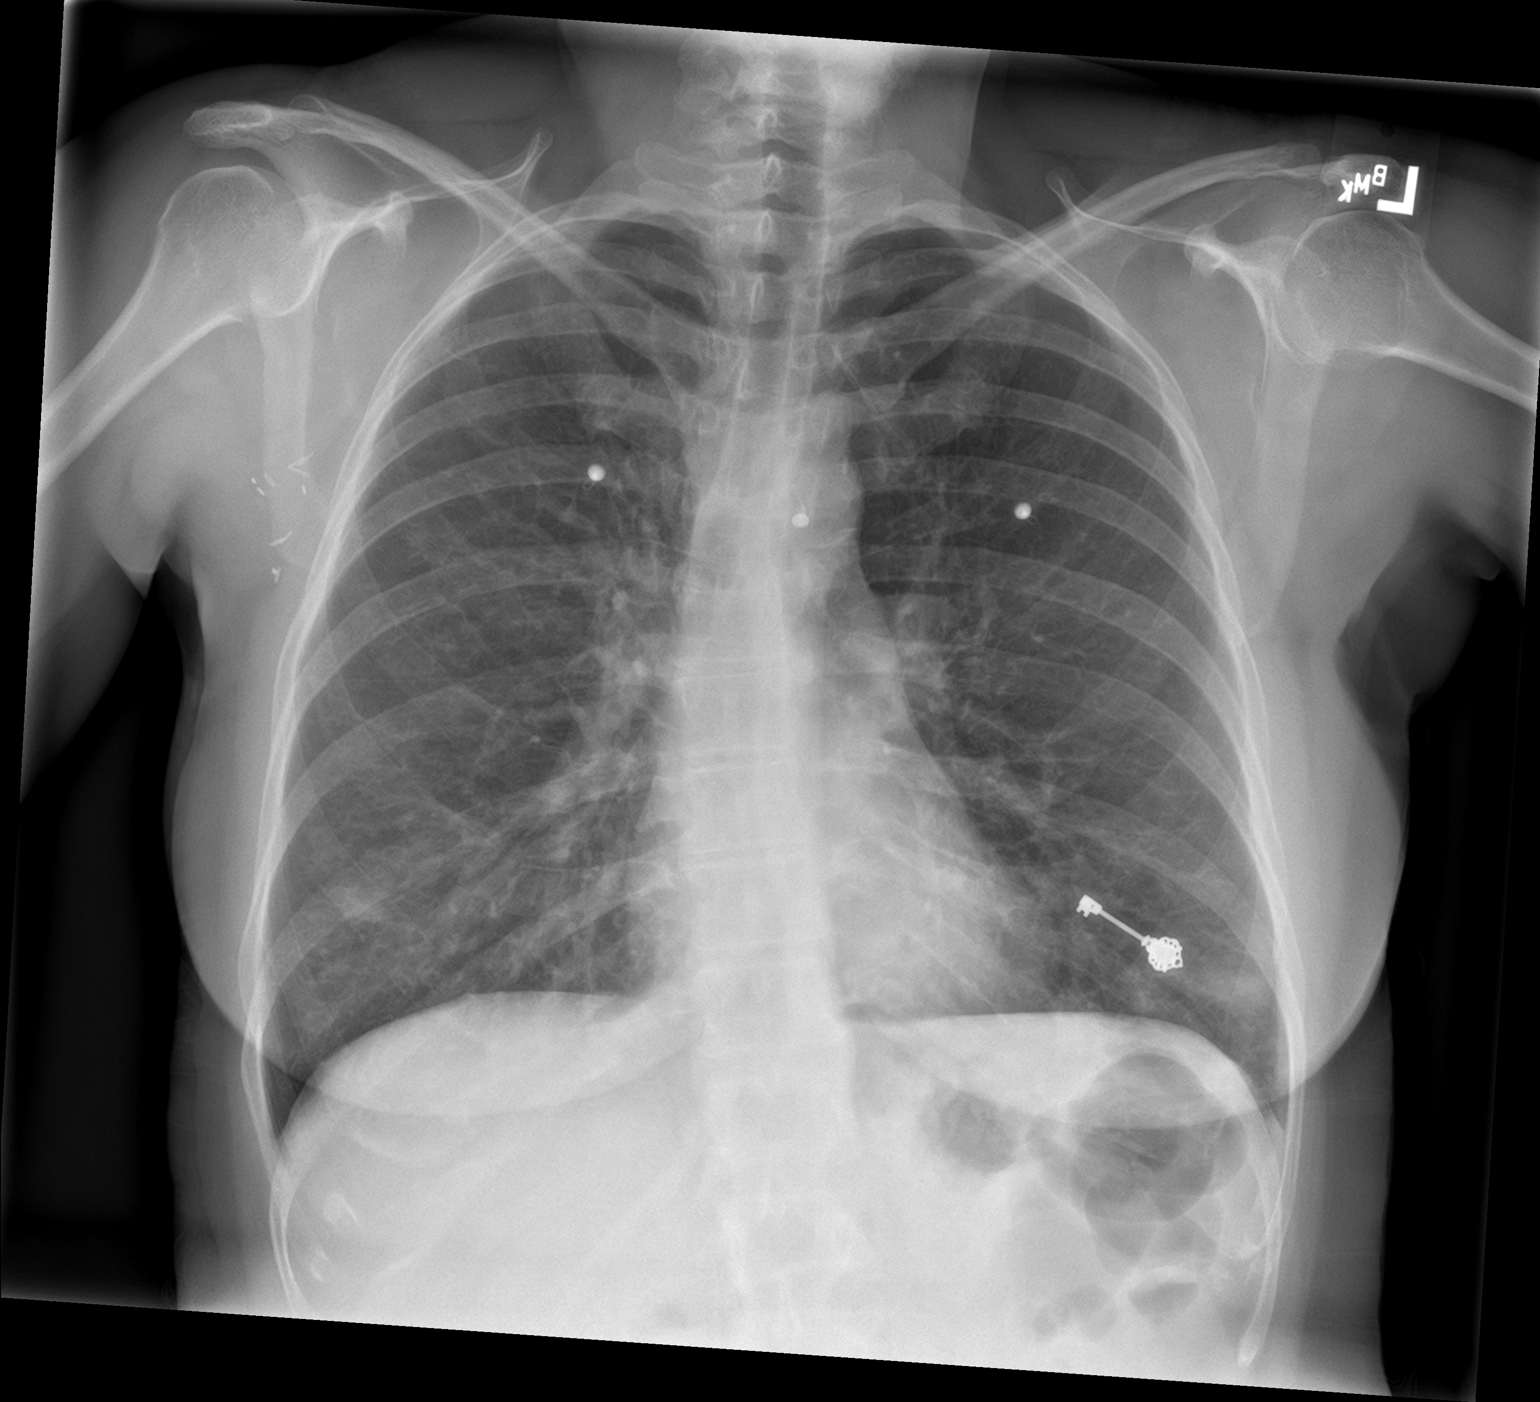

[chest lat]
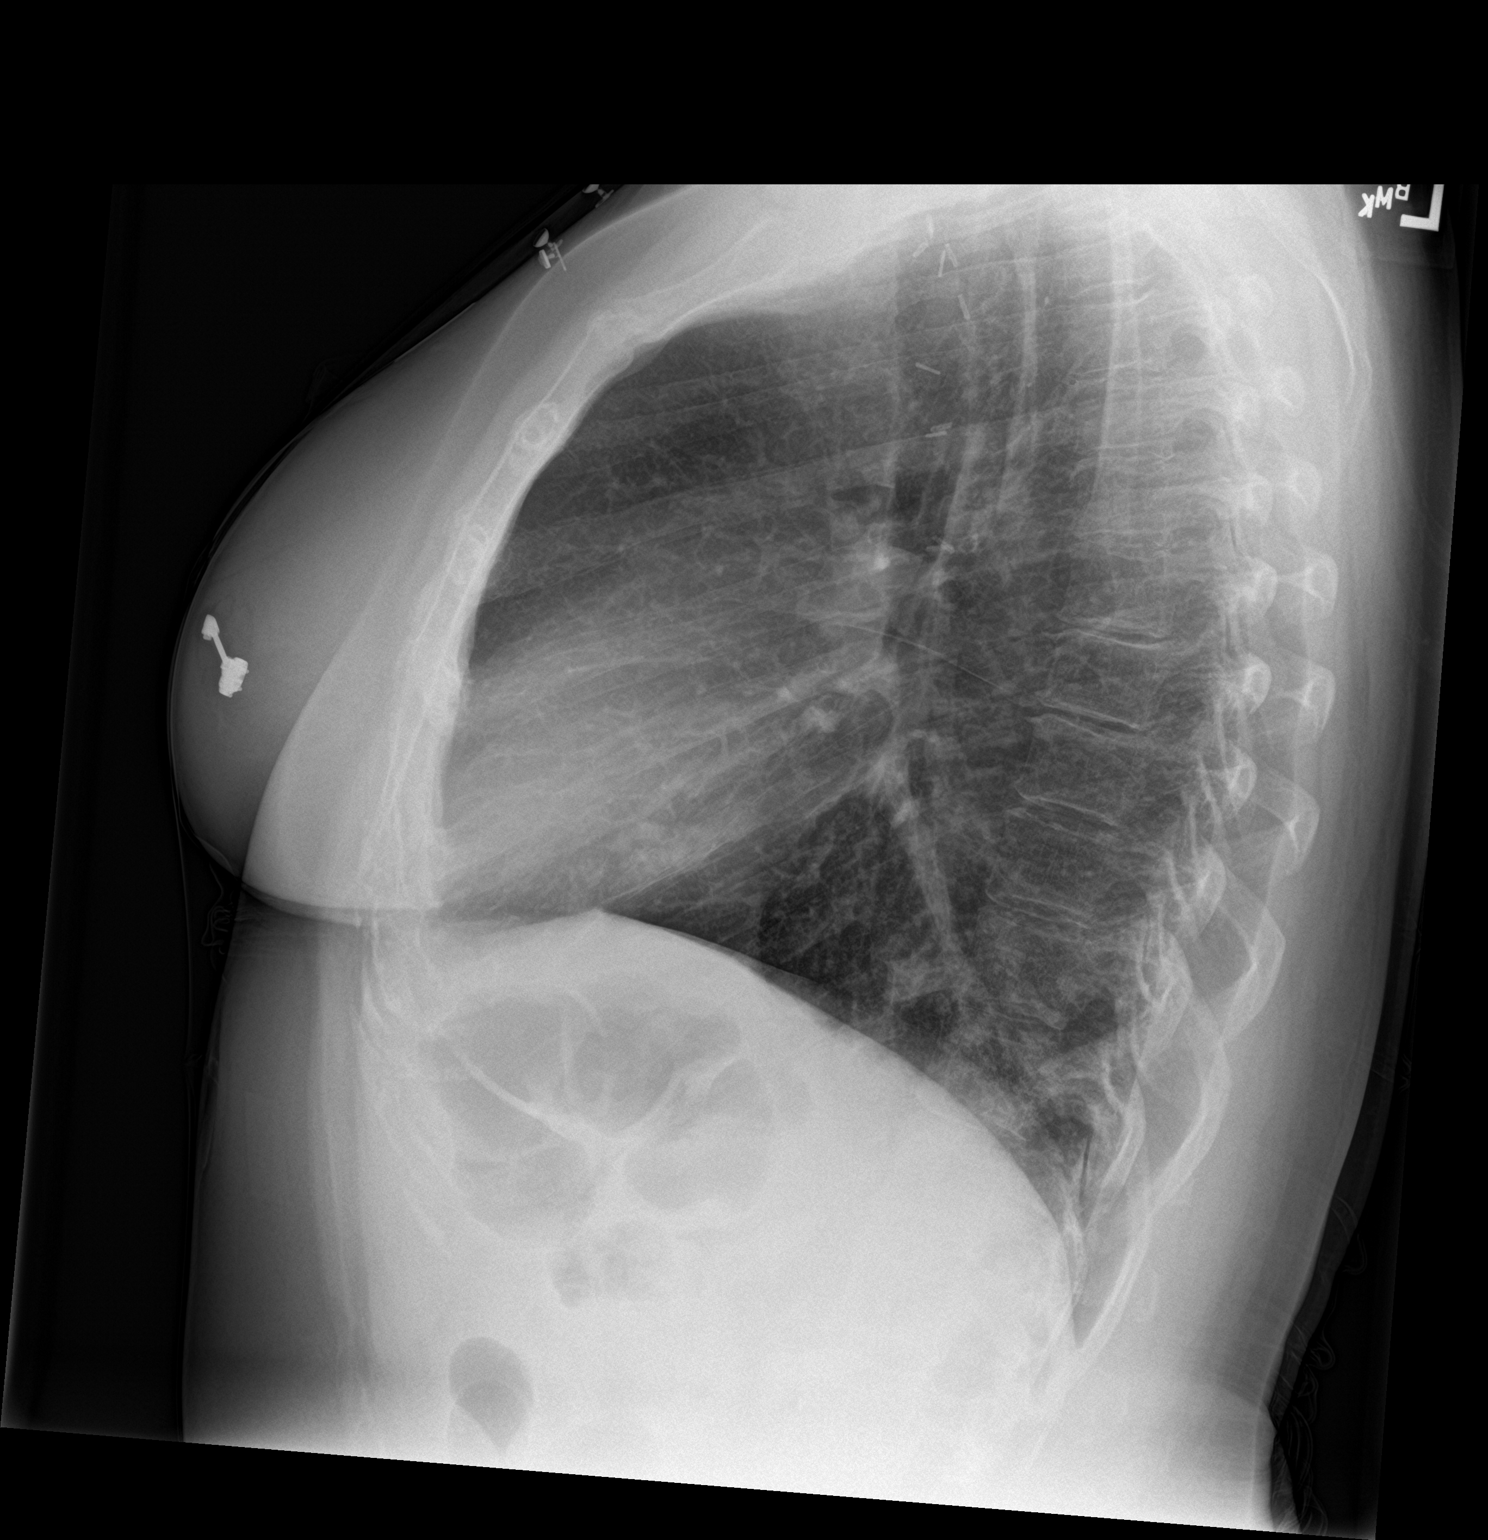

[2 of 2 positions shown; findings below may reference images not displayed]

FINDINGS: Stable normal cardiac silhouette given projection and technique.
Calcific aortic atherosclerosis. No consolidation, effusion, or
pneumothorax. Surgical clips project over right axilla. No acute
osseous abnormality is evident.
IMPRESSION: No acute pulmonary process identified.

## 2020-12-28 DIAGNOSIS — F902 Attention-deficit hyperactivity disorder, combined type: Secondary | ICD-10-CM | POA: Diagnosis not present

## 2021-03-24 DIAGNOSIS — F902 Attention-deficit hyperactivity disorder, combined type: Secondary | ICD-10-CM | POA: Diagnosis not present

## 2021-05-28 DIAGNOSIS — K420 Umbilical hernia with obstruction, without gangrene: Secondary | ICD-10-CM | POA: Diagnosis not present

## 2021-05-28 DIAGNOSIS — Z79899 Other long term (current) drug therapy: Secondary | ICD-10-CM | POA: Diagnosis not present

## 2021-05-28 DIAGNOSIS — Z87891 Personal history of nicotine dependence: Secondary | ICD-10-CM | POA: Diagnosis not present

## 2021-05-28 DIAGNOSIS — Z91013 Allergy to seafood: Secondary | ICD-10-CM | POA: Diagnosis not present

## 2021-05-28 DIAGNOSIS — R109 Unspecified abdominal pain: Secondary | ICD-10-CM | POA: Diagnosis not present

## 2021-05-28 DIAGNOSIS — R350 Frequency of micturition: Secondary | ICD-10-CM | POA: Diagnosis not present

## 2021-05-28 DIAGNOSIS — F419 Anxiety disorder, unspecified: Secondary | ICD-10-CM | POA: Diagnosis not present

## 2021-05-28 DIAGNOSIS — N281 Cyst of kidney, acquired: Secondary | ICD-10-CM | POA: Diagnosis not present

## 2021-05-28 DIAGNOSIS — R1110 Vomiting, unspecified: Secondary | ICD-10-CM | POA: Diagnosis not present

## 2021-05-28 DIAGNOSIS — K439 Ventral hernia without obstruction or gangrene: Secondary | ICD-10-CM | POA: Diagnosis not present

## 2021-06-23 DIAGNOSIS — F902 Attention-deficit hyperactivity disorder, combined type: Secondary | ICD-10-CM | POA: Diagnosis not present

## 2021-06-28 DIAGNOSIS — K828 Other specified diseases of gallbladder: Secondary | ICD-10-CM | POA: Diagnosis not present

## 2021-06-28 DIAGNOSIS — K432 Incisional hernia without obstruction or gangrene: Secondary | ICD-10-CM | POA: Diagnosis not present

## 2021-07-15 DIAGNOSIS — K469 Unspecified abdominal hernia without obstruction or gangrene: Secondary | ICD-10-CM | POA: Diagnosis not present

## 2021-07-15 DIAGNOSIS — Z79899 Other long term (current) drug therapy: Secondary | ICD-10-CM | POA: Diagnosis not present

## 2021-07-15 DIAGNOSIS — F17210 Nicotine dependence, cigarettes, uncomplicated: Secondary | ICD-10-CM | POA: Diagnosis not present

## 2021-07-15 DIAGNOSIS — K8010 Calculus of gallbladder with chronic cholecystitis without obstruction: Secondary | ICD-10-CM | POA: Diagnosis not present

## 2021-07-15 DIAGNOSIS — K439 Ventral hernia without obstruction or gangrene: Secondary | ICD-10-CM | POA: Diagnosis not present

## 2021-07-15 DIAGNOSIS — K828 Other specified diseases of gallbladder: Secondary | ICD-10-CM | POA: Diagnosis not present

## 2021-07-15 DIAGNOSIS — K8020 Calculus of gallbladder without cholecystitis without obstruction: Secondary | ICD-10-CM | POA: Diagnosis not present

## 2021-07-29 DIAGNOSIS — K8050 Calculus of bile duct without cholangitis or cholecystitis without obstruction: Secondary | ICD-10-CM | POA: Diagnosis not present

## 2021-07-29 DIAGNOSIS — Z972 Presence of dental prosthetic device (complete) (partial): Secondary | ICD-10-CM | POA: Diagnosis not present

## 2021-07-29 DIAGNOSIS — F419 Anxiety disorder, unspecified: Secondary | ICD-10-CM | POA: Diagnosis not present

## 2021-07-29 DIAGNOSIS — Z91013 Allergy to seafood: Secondary | ICD-10-CM | POA: Diagnosis not present

## 2021-07-29 DIAGNOSIS — Z79899 Other long term (current) drug therapy: Secondary | ICD-10-CM | POA: Diagnosis not present

## 2021-07-29 DIAGNOSIS — G43909 Migraine, unspecified, not intractable, without status migrainosus: Secondary | ICD-10-CM | POA: Diagnosis not present

## 2021-07-29 DIAGNOSIS — R932 Abnormal findings on diagnostic imaging of liver and biliary tract: Secondary | ICD-10-CM | POA: Diagnosis not present

## 2021-07-29 DIAGNOSIS — K838 Other specified diseases of biliary tract: Secondary | ICD-10-CM | POA: Diagnosis not present

## 2021-11-17 DIAGNOSIS — F902 Attention-deficit hyperactivity disorder, combined type: Secondary | ICD-10-CM | POA: Diagnosis not present

## 2022-02-10 DIAGNOSIS — F902 Attention-deficit hyperactivity disorder, combined type: Secondary | ICD-10-CM | POA: Diagnosis not present

## 2022-05-13 DIAGNOSIS — F902 Attention-deficit hyperactivity disorder, combined type: Secondary | ICD-10-CM | POA: Diagnosis not present

## 2022-05-30 DIAGNOSIS — F902 Attention-deficit hyperactivity disorder, combined type: Secondary | ICD-10-CM | POA: Diagnosis not present

## 2022-08-15 DIAGNOSIS — G8929 Other chronic pain: Secondary | ICD-10-CM | POA: Diagnosis not present

## 2022-08-15 DIAGNOSIS — F411 Generalized anxiety disorder: Secondary | ICD-10-CM | POA: Diagnosis not present

## 2022-08-15 DIAGNOSIS — F902 Attention-deficit hyperactivity disorder, combined type: Secondary | ICD-10-CM | POA: Diagnosis not present

## 2022-08-29 DIAGNOSIS — Z79899 Other long term (current) drug therapy: Secondary | ICD-10-CM | POA: Diagnosis not present

## 2022-08-30 ENCOUNTER — Telehealth: Payer: Self-pay

## 2022-08-30 NOTE — Telephone Encounter (Unsigned)
Copied from [place] [id number]. Topic: Appointment Scheduling - Scheduling Inquiry for Clinic >> Aug 30, 2022 12:49 PM Devoria Glassing wrote: Reason for CRM: pt last seen 12/28/2018.  It has been over 3 yrs. Will you accept her back?  Pt has a neurologist that was wanting to send her pcp her lab results due to some values that are concerning.  Please advise.

## 2022-08-31 NOTE — Telephone Encounter (Signed)
Yes but she will have to be seen in my next new patient slot

## 2022-08-31 NOTE — Telephone Encounter (Signed)
New pt scheduled for 03/16/2023   Thanks,   -Mickel Baas

## 2022-11-15 DIAGNOSIS — G8929 Other chronic pain: Secondary | ICD-10-CM | POA: Diagnosis not present

## 2022-11-15 DIAGNOSIS — F902 Attention-deficit hyperactivity disorder, combined type: Secondary | ICD-10-CM | POA: Diagnosis not present

## 2023-02-02 DIAGNOSIS — K432 Incisional hernia without obstruction or gangrene: Secondary | ICD-10-CM | POA: Diagnosis not present

## 2023-02-16 DIAGNOSIS — F902 Attention-deficit hyperactivity disorder, combined type: Secondary | ICD-10-CM | POA: Diagnosis not present

## 2023-03-10 DIAGNOSIS — K439 Ventral hernia without obstruction or gangrene: Secondary | ICD-10-CM | POA: Diagnosis not present

## 2023-03-10 DIAGNOSIS — K432 Incisional hernia without obstruction or gangrene: Secondary | ICD-10-CM | POA: Diagnosis not present

## 2023-03-10 DIAGNOSIS — Z79899 Other long term (current) drug therapy: Secondary | ICD-10-CM | POA: Diagnosis not present

## 2023-03-10 HISTORY — PX: HERNIA REPAIR: SHX51

## 2023-03-16 ENCOUNTER — Encounter: Payer: Self-pay | Admitting: Internal Medicine

## 2023-03-16 ENCOUNTER — Ambulatory Visit (INDEPENDENT_AMBULATORY_CARE_PROVIDER_SITE_OTHER): Payer: Medicare Other | Admitting: Internal Medicine

## 2023-03-16 VITALS — BP 133/80 | HR 91 | Ht <= 58 in | Wt 159.9 lb

## 2023-03-16 DIAGNOSIS — F419 Anxiety disorder, unspecified: Secondary | ICD-10-CM

## 2023-03-16 DIAGNOSIS — F5104 Psychophysiologic insomnia: Secondary | ICD-10-CM

## 2023-03-16 DIAGNOSIS — R519 Headache, unspecified: Secondary | ICD-10-CM

## 2023-03-16 DIAGNOSIS — Z1159 Encounter for screening for other viral diseases: Secondary | ICD-10-CM | POA: Diagnosis not present

## 2023-03-16 DIAGNOSIS — Z0001 Encounter for general adult medical examination with abnormal findings: Secondary | ICD-10-CM

## 2023-03-16 DIAGNOSIS — R7309 Other abnormal glucose: Secondary | ICD-10-CM

## 2023-03-16 DIAGNOSIS — E6609 Other obesity due to excess calories: Secondary | ICD-10-CM | POA: Diagnosis not present

## 2023-03-16 DIAGNOSIS — F32A Depression, unspecified: Secondary | ICD-10-CM

## 2023-03-16 DIAGNOSIS — E7800 Pure hypercholesterolemia, unspecified: Secondary | ICD-10-CM

## 2023-03-16 DIAGNOSIS — Z6833 Body mass index (BMI) 33.0-33.9, adult: Secondary | ICD-10-CM | POA: Diagnosis not present

## 2023-03-16 DIAGNOSIS — I700 Atherosclerosis of aorta: Secondary | ICD-10-CM

## 2023-03-16 DIAGNOSIS — F988 Other specified behavioral and emotional disorders with onset usually occurring in childhood and adolescence: Secondary | ICD-10-CM | POA: Diagnosis not present

## 2023-03-16 DIAGNOSIS — E663 Overweight: Secondary | ICD-10-CM | POA: Insufficient documentation

## 2023-03-16 LAB — CBC: Platelets: 438 10*3/uL — ABNORMAL HIGH (ref 140–400)

## 2023-03-16 MED ORDER — ASPIRIN 81 MG PO TBEC: 81.0000 mg | DELAYED_RELEASE_TABLET | Freq: Every day | ORAL | 12 refills | Status: DC

## 2023-03-16 NOTE — Patient Instructions (Signed)

## 2023-03-16 NOTE — Assessment & Plan Note (Signed)
Managed on Adderall, prescribed psychiatry

## 2023-03-16 NOTE — Assessment & Plan Note (Signed)
Managed on Xanax, prescribed by psychiatry

## 2023-03-16 NOTE — Assessment & Plan Note (Signed)
C-Met and lipid profile today Encouraged her to consume low-fat diet Will likely need to restart atorvastatin

## 2023-03-16 NOTE — Assessment & Plan Note (Signed)
Managed on Ambien, prescribed psychiatry

## 2023-03-16 NOTE — Assessment & Plan Note (Signed)
Managed on hydrocodone, prescribed by psychiatry

## 2023-03-16 NOTE — Progress Notes (Signed)
HPI  Patient presents to clinic today to establish care and for management of the conditions listed below.  ADD: She reports mainly inattention.  She is taking Adderall as prescribed.  She follows with neuropsych.  Frequent Headaches: These occur almost daily in the winter.  Triggered by accident she had many years ago.  She takes Hydrocodone as prescribed.  She follows with neuropsych.  Anxiety and Depression: Chronic, managed on Xanax.  She follows with neuropsych.  She denies SI/HI.  Insomnia.  She has difficulty falling and staying asleep.  She takes Ambien as prescribed.  She follows with neuropsych.  HLD with Aortic Atheroscleross: Her last LDL was 141, triglycerides 67, 11/2018.  She is no longer taking Atorvastatin. She is not currently taking Aspirin.  She tries to consume low-fat diet.   Past Medical History:  Diagnosis Date   ADD (attention deficit disorder)    Amenorrhea    Anxiety    Depression    Insomnia    Malpositioned IUD    Uterine perforation by intrauterine contraceptive device    UTI (lower urinary tract infection)    Vaginal Pap smear, abnormal     Current Outpatient Medications  Medication Sig Dispense Refill   ALPRAZolam (XANAX) 1 MG tablet Take 1 mg by mouth 3 (three) times daily.     amphetamine-dextroamphetamine (ADDERALL) 10 MG tablet Take 10 mg by mouth 2 (two) times daily.     atorvastatin (LIPITOR) 10 MG tablet Take 1 tablet (10 mg total) by mouth daily. MUST SCHEDULE LAB APPOINTMENT 30 tablet 0   HYDROcodone-acetaminophen (NORCO/VICODIN) 5-325 MG tablet Take 2 tablets by mouth 3 (three) times daily.     Vitamin D, Ergocalciferol, (DRISDOL) 1.25 MG (50000 UT) CAPS capsule Take 1 capsule (50,000 Units total) by mouth every 7 (seven) days. 12 capsule 0   zolpidem (AMBIEN) 10 MG tablet Take by mouth.     No current facility-administered medications for this visit.    Allergies  Allergen Reactions   Shellfish Allergy     Family History  Problem  Relation Age of Onset   Breast cancer Maternal Aunt    Ovarian cancer Maternal Aunt    Stroke Mother    Breast cancer Maternal Grandmother    Breast cancer Maternal Aunt    Diabetes Neg Hx    Heart disease Neg Hx    Colon cancer Neg Hx     Social History   Socioeconomic History   Marital status: Married    Spouse name: Not on file   Number of children: Not on file   Years of education: Not on file   Highest education level: Not on file  Occupational History   Not on file  Tobacco Use   Smoking status: Every Day    Packs/day: 1.00    Years: 15.00    Additional pack years: 0.00    Total pack years: 15.00    Types: Cigarettes    Last attempt to quit: 12/17/2016    Years since quitting: 6.2   Smokeless tobacco: Never  Substance and Sexual Activity   Alcohol use: Yes    Alcohol/week: 2.0 standard drinks of alcohol    Types: 2 Shots of liquor per week    Comment: moderate   Drug use: No   Sexual activity: Yes    Birth control/protection: I.U.D.  Other Topics Concern   Not on file  Social History Narrative   Not on file   Social Determinants of Health   Financial Resource  Strain: Not on file  Food Insecurity: Not on file  Transportation Needs: Not on file  Physical Activity: Not on file  Stress: Not on file  Social Connections: Not on file  Intimate Partner Violence: Not on file    ROS:  Constitutional: Patient reports headaches.  Denies fever, malaise, fatigue, or abrupt weight changes.  HEENT: Denies eye pain, eye redness, ear pain, ringing in the ears, wax buildup, runny nose, nasal congestion, bloody nose, or sore throat. Respiratory: Denies difficulty breathing, shortness of breath, cough or sputum production.   Cardiovascular: Denies chest pain, chest tightness, palpitations or swelling in the hands or feet.  Gastrointestinal: Denies abdominal pain, bloating, constipation, diarrhea or blood in the stool.  GU: Denies frequency, urgency, pain with urination,  blood in urine, odor or discharge. Musculoskeletal: Denies decrease in range of motion, difficulty with gait, muscle pain or joint pain and swelling.  Skin: Denies redness, rashes, lesions or ulcercations.  Neurological: Patient reports inattention and insomnia.  Denies dizziness, difficulty with memory, difficulty with speech or problems with balance and coordination.  Psych: Patient has a history of anxiety and depression.  Denies SI/HI.  No other specific complaints in a complete review of systems (except as listed in HPI above).  PE:  BP 133/80 (BP Location: Left Arm, Patient Position: Sitting)   Pulse 91   Ht  (1.473 m)   Wt 159 lb 14.4 oz (72.5 kg)   LMP 03/11/2023 (Exact Date)   SpO2 100%   BMI 33.42 kg/m   Wt Readings from Last 3 Encounters:  12/13/18 139 lb (63 kg)  11/13/18 141 lb (64 kg)  11/02/18 135 lb (61.2 kg)    General: Appears her stated age, obese, in NAD. HEENT: Head: normal shape and size; Eyes: sclera white, no icterus, conjunctiva pink, PERRLA and EOMs intact;  Cardiovascular: Normal rate and rhythm. S1,S2 noted.  No murmur, rubs or gallops noted. No JVD or BLE edema. Pulmonary/Chest: Normal effort and positive vesicular breath sounds. No respiratory distress. No wheezes, rales or ronchi noted.  Musculoskeletal: No difficulty with gait.  Neurological: Alert and oriented. Coordination normal.  Psychiatric: Mood and affect normal. Behavior is normal. Judgment and thought content normal.    BMET    Component Value Date/Time   NA 139 12/13/2018 0944   NA 136 12/26/2014 0742   K 3.6 12/13/2018 0944   K 4.4 12/26/2014 0742   CL 103 12/13/2018 0944   CL 107 12/26/2014 0742   CO2 29 12/13/2018 0944   CO2 24 12/26/2014 0742   GLUCOSE 102 (H) 12/13/2018 0944   GLUCOSE 92 12/26/2014 0742   BUN 10 12/13/2018 0944   BUN 2 (L) 12/26/2014 0742   CREATININE 0.68 12/13/2018 0944   CREATININE 0.65 12/26/2014 0742   CALCIUM 9.9 12/13/2018 0944   CALCIUM  8.7 12/26/2014 0742   GFRNONAA >60 11/02/2018 2014   GFRNONAA >60 12/26/2014 0742   GFRAA >60 11/02/2018 2014   GFRAA >60 12/26/2014 0742    Lipid Panel     Component Value Date/Time   CHOL 242 (H) 12/13/2018 0944   TRIG 67.0 12/13/2018 0944   HDL 88.00 12/13/2018 0944   CHOLHDL 3 12/13/2018 0944   VLDL 13.4 12/13/2018 0944   LDLCALC 141 (H) 12/13/2018 0944    CBC    Component Value Date/Time   WBC 13.0 (H) 12/13/2018 0944   RBC 5.09 12/13/2018 0944   HGB 15.5 (H) 12/13/2018 0944   HGB 14.2 03/12/2015 1108  HCT 45.1 12/13/2018 0944   HCT 41.3 03/12/2015 1108   PLT 346.0 12/13/2018 0944   PLT 304 03/12/2015 1108   MCV 88.5 12/13/2018 0944   MCV 90 03/12/2015 1108   MCH 30.0 11/02/2018 2014   MCHC 34.3 12/13/2018 0944   RDW 14.1 12/13/2018 0944   RDW 12.8 03/12/2015 1108   LYMPHSABS 0.9 11/02/2018 2014   LYMPHSABS 3.1 12/26/2014 0742   MONOABS 1.4 (H) 11/02/2018 2014   MONOABS 0.9 12/26/2014 0742   EOSABS 0.0 11/02/2018 2014   EOSABS 0.2 12/26/2014 0742   BASOSABS 0.1 11/02/2018 2014   BASOSABS 0.1 12/26/2014 0742    Hgb A1C Lab Results  Component Value Date   HGBA1C 5.1 12/24/2014     Assessment and Plan:    RTC in 6 months for annual exam Nicki Reaper, NP

## 2023-03-16 NOTE — Assessment & Plan Note (Signed)
Encourage diet and exercise for weight loss 

## 2023-03-16 NOTE — Assessment & Plan Note (Signed)
C-Met and lipid profile today We will have her likely restart atorvastatin Encouraged her to consume low-fat diet Continue atorvastatin

## 2023-03-17 LAB — COMPLETE METABOLIC PANEL WITH GFR
AG Ratio: 1.6 (calc) (ref 1.0–2.5)
ALT: 25 U/L (ref 6–29)
AST: 23 U/L (ref 10–35)
Albumin: 4.2 g/dL (ref 3.6–5.1)
Alkaline phosphatase (APISO): 80 U/L (ref 31–125)
BUN: 15 mg/dL (ref 7–25)
CO2: 26 mmol/L (ref 20–32)
Calcium: 9.9 mg/dL (ref 8.6–10.2)
Chloride: 104 mmol/L (ref 98–110)
Creat: 0.62 mg/dL (ref 0.50–0.99)
Globulin: 2.6 g/dL (calc) (ref 1.9–3.7)
Glucose, Bld: 90 mg/dL (ref 65–99)
Potassium: 4.9 mmol/L (ref 3.5–5.3)
Sodium: 140 mmol/L (ref 135–146)
Total Bilirubin: 0.2 mg/dL (ref 0.2–1.2)
Total Protein: 6.8 g/dL (ref 6.1–8.1)
eGFR: 112 mL/min/{1.73_m2} (ref >=60)

## 2023-03-17 LAB — LIPID PANEL
Cholesterol: 244 mg/dL — ABNORMAL HIGH (ref <200)
HDL: 62 mg/dL (ref >=50)
LDL Cholesterol (Calc): 150 mg/dL (calc) — ABNORMAL HIGH
Non-HDL Cholesterol (Calc): 182 mg/dL (calc) — ABNORMAL HIGH (ref <130)
Total CHOL/HDL Ratio: 3.9 (calc) (ref <5.0)
Triglycerides: 188 mg/dL — ABNORMAL HIGH (ref <150)

## 2023-03-17 LAB — CBC
HCT: 44.5 % (ref 35.0–45.0)
Hemoglobin: 14.3 g/dL (ref 11.7–15.5)
MCH: 29.2 pg (ref 27.0–33.0)
MCHC: 32.1 g/dL (ref 32.0–36.0)
MCV: 90.8 fL (ref 80.0–100.0)
MPV: 9 fL (ref 7.5–12.5)
RBC: 4.9 10*6/uL (ref 3.80–5.10)
RDW: 12.3 % (ref 11.0–15.0)
WBC: 10.2 10*3/uL (ref 3.8–10.8)

## 2023-03-17 LAB — HEMOGLOBIN A1C
Hgb A1c MFr Bld: 5.3 % of total Hgb (ref <5.7)
Mean Plasma Glucose: 105 mg/dL
eAG (mmol/L): 5.8 mmol/L

## 2023-03-17 LAB — HEPATITIS C ANTIBODY: Hepatitis C Ab: NONREACTIVE

## 2023-03-17 MED ORDER — ATORVASTATIN CALCIUM 10 MG PO TABS: 10.0000 mg | ORAL_TABLET | Freq: Every day | ORAL | 1 refills | Status: DC

## 2023-03-17 NOTE — Addendum Note (Signed)
Addended by: Lorre Munroe on: 03/17/2023 09:48 AM   Modules accepted: Orders

## 2023-03-24 DIAGNOSIS — R140 Abdominal distension (gaseous): Secondary | ICD-10-CM | POA: Diagnosis not present

## 2023-03-24 DIAGNOSIS — Z8719 Personal history of other diseases of the digestive system: Secondary | ICD-10-CM | POA: Diagnosis not present

## 2023-03-24 DIAGNOSIS — G8918 Other acute postprocedural pain: Secondary | ICD-10-CM | POA: Diagnosis not present

## 2023-03-24 DIAGNOSIS — R21 Rash and other nonspecific skin eruption: Secondary | ICD-10-CM | POA: Diagnosis not present

## 2023-05-02 ENCOUNTER — Ambulatory Visit: Payer: Medicare Other | Admitting: Internal Medicine

## 2023-05-02 NOTE — Progress Notes (Deleted)
Subjective:    Patient ID: Kathleen Zavala, female    DOB: [birth year], 47 y.o.   MRN: [id number]  HPI  Patient presents to clinic today with complaint of.  Review of Systems     Past Medical History:  Diagnosis Date   ADD (attention deficit disorder)    Allergy    Amenorrhea    Anxiety    Depression    Headache    Insomnia    Malpositioned IUD    Uterine perforation by intrauterine contraceptive device    UTI (lower urinary tract infection)    Vaginal Pap smear, abnormal     Current Outpatient Medications  Medication Sig Dispense Refill   ALPRAZolam (XANAX) 1 MG tablet Take 1 mg by mouth 3 (three) times daily.     amphetamine-dextroamphetamine (ADDERALL) 10 MG tablet Take 10 mg by mouth 2 (two) times daily.     aspirin EC 81 MG tablet Take 1 tablet (81 mg total) by mouth daily. Swallow whole. 30 tablet 12   atorvastatin (LIPITOR) 10 MG tablet Take 1 tablet (10 mg total) by mouth daily. MUST SCHEDULE LAB APPOINTMENT 90 tablet 1   HYDROcodone-acetaminophen (NORCO/VICODIN) 5-325 MG tablet Take 2 tablets by mouth 3 (three) times daily.     zolpidem (AMBIEN) 10 MG tablet Take by mouth.     No current facility-administered medications for this visit.    Allergies  Allergen Reactions   Shellfish Allergy     Family History  Problem Relation Age of Onset   Stroke Mother    Healthy Sister    Healthy Sister    Healthy Brother    Breast cancer Maternal Aunt    Ovarian cancer Maternal Aunt    Breast cancer Maternal Aunt    Breast cancer Maternal Grandmother    Diabetes Neg Hx    Heart disease Neg Hx    Colon cancer Neg Hx     Social History   Socioeconomic History   Marital status: Single    Spouse name: Not on file   Number of children: Not on file   Years of education: Not on file   Highest education level: Not on file  Occupational History   Not on file  Tobacco Use   Smoking status: Every Day    Packs/day: 1.00    Years: 20.00    Additional pack  years: 0.00    Total pack years: 20.00    Types: Cigarettes    Last attempt to quit: 12/17/2016    Years since quitting: 6.3   Smokeless tobacco: Never  Vaping Use   Vaping Use: Never used  Substance and Sexual Activity   Alcohol use: Yes    Alcohol/week: 4.0 standard drinks of alcohol    Types: 2 Cans of beer, 2 Shots of liquor per week    Comment: moderate   Drug use: No   Sexual activity: Yes  Other Topics Concern   Not on file  Social History Narrative   Not on file   Social Determinants of Health   Financial Resource Strain: Not on file  Food Insecurity: Not on file  Transportation Needs: Not on file  Physical Activity: Not on file  Stress: Not on file  Social Connections: Not on file  Intimate Partner Violence: Not on file     Constitutional: Patient reports frequent headaches.  Denies fever, malaise, fatigue, or abrupt weight changes.  HEENT: Denies eye pain, eye redness, ear pain, ringing in the ears, wax buildup, runny  nose, nasal congestion, bloody nose, or sore throat. Respiratory: Denies difficulty breathing, shortness of breath, cough or sputum production.   Cardiovascular: Denies chest pain, chest tightness, palpitations or swelling in the hands or feet.  Gastrointestinal: Denies abdominal pain, bloating, constipation, diarrhea or blood in the stool.  GU: Denies urgency, frequency, pain with urination, burning sensation, blood in urine, odor or discharge. Musculoskeletal: Denies decrease in range of motion, difficulty with gait, muscle pain or joint pain and swelling.  Skin: Denies redness, rashes, lesions or ulcercations.  Neurological: Patient reports inattention and insomnia.  Denies dizziness, difficulty with memory, difficulty with speech or problems with balance and coordination.  Psych: Patient has a history of anxiety and depression.  Denies SI/HI.  No other specific complaints in a complete review of systems (except as listed in HPI  above).  Objective:   Physical Exam  There were no vitals taken for this visit. Wt Readings from Last 3 Encounters:  03/16/23 159 lb 14.4 oz (72.5 kg)  12/13/18 139 lb (63 kg)  11/13/18 141 lb (64 kg)    General: Appears their stated age, well developed, well nourished in NAD. Skin: Warm, dry and intact. No rashes, lesions or ulcerations noted. HEENT: Head: normal shape and size; Eyes: sclera white, no icterus, conjunctiva pink, PERRLA and EOMs intact; Ears: Tm's gray and intact, normal light reflex; Nose: mucosa pink and moist, septum midline; Throat/Mouth: Teeth present, mucosa pink and moist, no exudate, lesions or ulcerations noted.  Neck:  Neck supple, trachea midline. No masses, lumps or thyromegaly present.  Cardiovascular: Normal rate and rhythm. S1,S2 noted.  No murmur, rubs or gallops noted. No JVD or BLE edema. No carotid bruits noted. Pulmonary/Chest: Normal effort and positive vesicular breath sounds. No respiratory distress. No wheezes, rales or ronchi noted.  Abdomen: Soft and nontender. Normal bowel sounds. No distention or masses noted. Liver, spleen and kidneys non palpable. Musculoskeletal: Normal range of motion. No signs of joint swelling. No difficulty with gait.  Neurological: Alert and oriented. Cranial nerves II-XII grossly intact. Coordination normal.  Psychiatric: Mood and affect normal. Behavior is normal. Judgment and thought content normal.     BMET    Component Value Date/Time   NA 140 03/16/2023 1053   NA 136 12/26/2014 0742   K 4.9 03/16/2023 1053   K 4.4 12/26/2014 0742   CL 104 03/16/2023 1053   CL 107 12/26/2014 0742   CO2 26 03/16/2023 1053   CO2 24 12/26/2014 0742   GLUCOSE 90 03/16/2023 1053   GLUCOSE 92 12/26/2014 0742   BUN 15 03/16/2023 1053   BUN 2 (L) 12/26/2014 0742   CREATININE 0.62 03/16/2023 1053   CALCIUM 9.9 03/16/2023 1053   CALCIUM 8.7 12/26/2014 0742   GFRNONAA >60 11/02/2018 2014   GFRNONAA >60 12/26/2014 0742   GFRAA  >60 11/02/2018 2014   GFRAA >60 12/26/2014 0742    Lipid Panel     Component Value Date/Time   CHOL 244 (H) 03/16/2023 1053   TRIG 188 (H) 03/16/2023 1053   HDL 62 03/16/2023 1053   CHOLHDL 3.9 03/16/2023 1053   VLDL 13.4 12/13/2018 0944   LDLCALC 150 (H) 03/16/2023 1053    CBC    Component Value Date/Time   WBC 10.2 03/16/2023 1053   RBC 4.90 03/16/2023 1053   HGB 14.3 03/16/2023 1053   HGB 14.2 03/12/2015 1108   HCT 44.5 03/16/2023 1053   HCT 41.3 03/12/2015 1108   PLT 438 (H) 03/16/2023 1053   PLT  304 03/12/2015 1108   MCV 90.8 03/16/2023 1053   MCV 90 03/12/2015 1108   MCH 29.2 03/16/2023 1053   MCHC 32.1 03/16/2023 1053   RDW 12.3 03/16/2023 1053   RDW 12.8 03/12/2015 1108   LYMPHSABS 0.9 11/02/2018 2014   LYMPHSABS 3.1 12/26/2014 0742   MONOABS 1.4 (H) 11/02/2018 2014   MONOABS 0.9 12/26/2014 0742   EOSABS 0.0 11/02/2018 2014   EOSABS 0.2 12/26/2014 0742   BASOSABS 0.1 11/02/2018 2014   BASOSABS 0.1 12/26/2014 0742    Hgb A1C Lab Results  Component Value Date   HGBA1C 5.3 03/16/2023            Assessment & Plan:      RTC in 4 months for follow-up chronic conditions Nicki Reaper, NP

## 2023-05-18 DIAGNOSIS — F902 Attention-deficit hyperactivity disorder, combined type: Secondary | ICD-10-CM | POA: Diagnosis not present

## 2023-06-14 ENCOUNTER — Other Ambulatory Visit: Payer: Self-pay | Admitting: Internal Medicine

## 2023-06-14 NOTE — Telephone Encounter (Signed)
  Requested Prescriptions  Pending Prescriptions Disp Refills   atorvastatin (LIPITOR) 10 MG tablet [Pharmacy Med Name: ATORVASTATIN CALCIUM 10 MG TAB] 90 tablet 1    Sig: TAKE ONE TABLET BY MOUTH EVERY DAY MUST HAVE LAB APPT     Cardiovascular:  Antilipid - Statins Failed - 06/14/2023 10:33 AM      Failed - Lipid Panel in normal range within the last 12 months    Cholesterol  Date Value Ref Range Status  03/16/2023 244 (H) <200 mg/dL Final   LDL Cholesterol (Calc)  Date Value Ref Range Status  03/16/2023 150 (H) mg/dL (calc) Final    Comment:    Reference range: <100 . Desirable range <100 mg/dL for primary prevention;   <70 mg/dL for patients with CHD or diabetic patients  with > or = 2 CHD risk factors. Marland Kitchen LDL-C is now calculated using the Martin-Hopkins  calculation, which is a validated novel method providing  better accuracy than the Friedewald equation in the  estimation of LDL-C.  Horald Pollen et al. Lenox Ahr. 2956;213(08): 2061-2068  (http://education.[facility].com/faq/FAQ164)    HDL  Date Value Ref Range Status  03/16/2023 62 > OR = 50 mg/dL Final   Triglycerides  Date Value Ref Range Status  03/16/2023 188 (H) <150 mg/dL Final         Passed - Patient is not pregnant      Passed - Valid encounter within last 12 months    Recent Outpatient Visits           3 months ago Aortic atherosclerosis [facility])   [facility] [place], Salvadore Oxford, NP       Future Appointments             In 3 months Baity, Salvadore Oxford, NP [facility]

## 2023-06-14 NOTE — Telephone Encounter (Signed)
Call to patient-  she has been scheduled for lab appointment- Courtesy RF has been given.Patient would like B12 level check also- advised I would send request to PCP

## 2023-06-22 ENCOUNTER — Other Ambulatory Visit: Payer: Medicare Other

## 2023-06-22 DIAGNOSIS — E7800 Pure hypercholesterolemia, unspecified: Secondary | ICD-10-CM | POA: Diagnosis not present

## 2023-06-23 LAB — COMPLETE METABOLIC PANEL WITH GFR: Total Bilirubin: 0.5 mg/dL (ref 0.2–1.2)

## 2023-07-03 ENCOUNTER — Telehealth: Payer: Self-pay | Admitting: Internal Medicine

## 2023-07-03 NOTE — Telephone Encounter (Signed)
LVM to sched AWV 07/03/2023  Verlee Rossetti; Care Guide Ambulatory Clinical Support [place] [facility] Direct Dial: [phone]

## 2023-08-01 ENCOUNTER — Telehealth: Payer: Self-pay | Admitting: Internal Medicine

## 2023-08-01 NOTE — Telephone Encounter (Signed)
LM 08/01/2023 to schedule AWV   Kathleen Zavala; Care Guide Ambulatory Clinical Support [place] [facility] Direct Dial: [phone]

## 2023-08-17 DIAGNOSIS — F902 Attention-deficit hyperactivity disorder, combined type: Secondary | ICD-10-CM | POA: Diagnosis not present

## 2023-08-17 DIAGNOSIS — F411 Generalized anxiety disorder: Secondary | ICD-10-CM | POA: Diagnosis not present

## 2023-09-12 ENCOUNTER — Telehealth: Payer: Self-pay | Admitting: Internal Medicine

## 2023-09-12 NOTE — Telephone Encounter (Signed)
Called LVM 09/12/2023 to schedule Annual Wellness Visit  Kathleen Zavala; Care Guide Ambulatory Clinical Support [place] [facility] Direct Dial: [phone]

## 2023-09-18 ENCOUNTER — Ambulatory Visit (INDEPENDENT_AMBULATORY_CARE_PROVIDER_SITE_OTHER): Payer: Medicare Other | Admitting: Internal Medicine

## 2023-09-18 ENCOUNTER — Encounter: Payer: Self-pay | Admitting: Internal Medicine

## 2023-09-18 VITALS — BP 132/76 | Ht <= 58 in | Wt 140.0 lb

## 2023-09-18 DIAGNOSIS — E538 Deficiency of other specified B group vitamins: Secondary | ICD-10-CM

## 2023-09-18 DIAGNOSIS — F32A Depression, unspecified: Secondary | ICD-10-CM

## 2023-09-18 DIAGNOSIS — R739 Hyperglycemia, unspecified: Secondary | ICD-10-CM | POA: Diagnosis not present

## 2023-09-18 DIAGNOSIS — E7800 Pure hypercholesterolemia, unspecified: Secondary | ICD-10-CM | POA: Diagnosis not present

## 2023-09-18 DIAGNOSIS — Z1211 Encounter for screening for malignant neoplasm of colon: Secondary | ICD-10-CM | POA: Diagnosis not present

## 2023-09-18 DIAGNOSIS — R519 Headache, unspecified: Secondary | ICD-10-CM | POA: Diagnosis not present

## 2023-09-18 DIAGNOSIS — I700 Atherosclerosis of aorta: Secondary | ICD-10-CM

## 2023-09-18 DIAGNOSIS — F900 Attention-deficit hyperactivity disorder, predominantly inattentive type: Secondary | ICD-10-CM

## 2023-09-18 DIAGNOSIS — F419 Anxiety disorder, unspecified: Secondary | ICD-10-CM | POA: Diagnosis not present

## 2023-09-18 DIAGNOSIS — E663 Overweight: Secondary | ICD-10-CM | POA: Diagnosis not present

## 2023-09-18 DIAGNOSIS — F5104 Psychophysiologic insomnia: Secondary | ICD-10-CM | POA: Diagnosis not present

## 2023-09-18 DIAGNOSIS — Z6829 Body mass index (BMI) 29.0-29.9, adult: Secondary | ICD-10-CM

## 2023-09-18 MED ORDER — HYDROCORTISONE ACETATE 25 MG RE SUPP: 25.0000 mg | Freq: Two times a day (BID) | RECTAL | 0 refills | Status: AC

## 2023-09-18 MED ORDER — ASPIRIN 81 MG PO TBEC: 81.0000 mg | DELAYED_RELEASE_TABLET | Freq: Every day | ORAL | Status: AC

## 2023-09-18 NOTE — Assessment & Plan Note (Signed)
Encouraged diet and exercise for weight loss ?

## 2023-09-18 NOTE — Assessment & Plan Note (Signed)
Continue Adderall per psychiatry

## 2023-09-18 NOTE — Assessment & Plan Note (Signed)
Continue Ambien per psychiatry

## 2023-09-18 NOTE — Assessment & Plan Note (Signed)
C-Met and lipid profile today Encouraged her to consume a low-fat diet 

## 2023-09-18 NOTE — Assessment & Plan Note (Signed)
Continue Xanax per psychiatry Support offered

## 2023-09-18 NOTE — Assessment & Plan Note (Signed)
C-Met and lipid profile today Encouraged her to consume a low-fat diet Advised her to start baby aspirin 81 mg daily Will discuss starting atorvastatin back if LDL >70

## 2023-09-18 NOTE — Patient Instructions (Signed)

## 2023-09-18 NOTE — Progress Notes (Signed)
Subjective:    Patient ID: Kathleen Zavala, female    DOB: 1976/01/15, 47 y.o.   MRN: [id number]  HPI  Patient presents to clinic today for 69-month follow-up of chronic conditions.  ADD: She reports mainly inattention.  She is taking adderall as prescribed.  She follows with neuropsych.  Frequent headaches: These occur almost daily.  Triggered by traumatic brain injury.  She takes hydrocodone as prescribed.  She follows with neuropsych.  Anxiety and depression: Chronic, managed on xanax.  She is not currently seeing a therapist but does follow with neuropsychiatry.  She denies SI/HI.  Insomnia: She has difficulty falling and staying asleep.  She is taking [place] as prescribed.  There is no sleep study on file.  She follows with neuropsych.  HLD with aortic atherosclerosis: Her last LDL was 115, triglycerides 84, 05/2023.  She is not taking atorvastatin or aspirin as prescribed.  She tries to consume a low-fat diet.  Thrombocytosis: Her last platelet count was 438, 02/2023.  She does not follow with hematology.  Review of Systems     Past Medical History:  Diagnosis Date   ADD (attention deficit disorder)    Allergy    Amenorrhea    Anxiety    Depression    Headache    Insomnia    Malpositioned IUD    Uterine perforation by intrauterine contraceptive device    UTI (lower urinary tract infection)    Vaginal Pap smear, abnormal      Allergies  Allergen Reactions   Shellfish Allergy     Family History  Problem Relation Age of Onset   Stroke Mother    Healthy Sister    Healthy Sister    Healthy Brother    Breast cancer Maternal Aunt    Ovarian cancer Maternal Aunt    Breast cancer Maternal Aunt    Breast cancer Maternal Grandmother    Diabetes Neg Hx    Heart disease Neg Hx    Colon cancer Neg Hx     Social History   Socioeconomic History   Marital status: Single    Spouse name: Not on file   Number of children: Not on file   Years of education: Not on  file   Highest education level: Not on file  Occupational History   Not on file  Tobacco Use   Smoking status: Every Day    Current packs/day: 0.00    Average packs/day: 1 pack/day for 20.0 years (20.0 ttl pk-yrs)    Types: Cigarettes    Start date: 12/17/1996    Last attempt to quit: 12/17/2016    Years since quitting: 6.7   Smokeless tobacco: Never  Vaping Use   Vaping status: Never Used  Substance and Sexual Activity   Alcohol use: Yes    Alcohol/week: 4.0 standard drinks of alcohol    Types: 2 Cans of beer, 2 Shots of liquor per week    Comment: moderate   Drug use: No   Sexual activity: Yes  Other Topics Concern   Not on file  Social History Narrative   Not on file   Social Determinants of Health   Financial Resource Strain: Not on file  Food Insecurity: Not on file  Transportation Needs: Not on file  Physical Activity: Not on file  Stress: Not on file  Social Connections: Not on file  Intimate Partner Violence: Not on file     Constitutional: Patient reports headaches.  Denies fever, malaise, fatigue, or abrupt weight  changes.  HEENT: Denies eye pain, eye redness, ear pain, ringing in the ears, wax buildup, runny nose, nasal congestion, bloody nose, or sore throat. Respiratory: Denies difficulty breathing, shortness of breath, cough or sputum production.   Cardiovascular: Denies chest pain, chest tightness, palpitations or swelling in the hands or feet.  Gastrointestinal: Denies abdominal pain, bloating, constipation, diarrhea or blood in the stool.  GU: Patient reports hemorrhoid.  Denies urgency, frequency, pain with urination, burning sensation, blood in urine, odor or discharge. Musculoskeletal: Denies decrease in range of motion, difficulty with gait, muscle pain or joint pain and swelling.  Skin: Denies redness, rashes, lesions or ulcercations.  Neurological: Patient reports inattention, insomnia.  Denies dizziness, difficulty with memory, difficulty with  speech or problems with balance and coordination.  Psych: Patient has a history of anxiety and depression.  Denies SI/HI.  No other specific complaints in a complete review of systems (except as listed in HPI above).  Objective:   Physical Exam   BP 132/76   Ht 4\' 10"  (1.473 m)   Wt 140 lb (63.5 kg)   LMP 08/29/2023   BMI 29.26 kg/m   Wt Readings from Last 3 Encounters:  03/16/23 159 lb 14.4 oz (72.5 kg)  12/13/18 139 lb (63 kg)  11/13/18 141 lb (64 kg)    General: Appears her stated age, well developed, well nourished in NAD. Skin: Warm, dry and intact.  HEENT: Head: normal shape and size; Eyes: sclera white, no icterus, conjunctiva pink, PERRLA and EOMs intact;  Cardiovascular: Normal rate and rhythm. S1,S2 noted.  No murmur, rubs or gallops noted. No JVD or BLE edema. Pulmonary/Chest: Normal effort and positive vesicular breath sounds. No respiratory distress. No wheezes, rales or ronchi noted.  Musculoskeletal: No difficulty with gait.  Neurological: Alert and oriented. Coordination normal.  Psychiatric: Mood and affect normal. Behavior is normal. Judgment and thought content normal.    BMET    Component Value Date/Time   NA 139 06/22/2023 1022   NA 136 12/26/2014 0742   K 4.3 06/22/2023 1022   K 4.4 12/26/2014 0742   CL 101 06/22/2023 1022   CL 107 12/26/2014 0742   CO2 27 06/22/2023 1022   CO2 24 12/26/2014 0742   GLUCOSE 84 06/22/2023 1022   GLUCOSE 92 12/26/2014 0742   BUN 10 06/22/2023 1022   BUN 2 (L) 12/26/2014 0742   CREATININE 0.53 06/22/2023 1022   CALCIUM 9.8 06/22/2023 1022   CALCIUM 8.7 12/26/2014 0742   GFRNONAA >60 11/02/2018 2014   GFRNONAA >60 12/26/2014 0742   GFRAA >60 11/02/2018 2014   GFRAA >60 12/26/2014 0742    Lipid Panel     Component Value Date/Time   CHOL 207 (H) 06/22/2023 1022   TRIG 84 06/22/2023 1022   HDL 74 06/22/2023 1022   CHOLHDL 2.8 06/22/2023 1022   VLDL 13.4 12/13/2018 0944   LDLCALC 115 (H) 06/22/2023 1022     CBC    Component Value Date/Time   WBC 10.2 03/16/2023 1053   RBC 4.90 03/16/2023 1053   HGB 14.3 03/16/2023 1053   HGB 14.2 03/12/2015 1108   HCT 44.5 03/16/2023 1053   HCT 41.3 03/12/2015 1108   PLT 438 (H) 03/16/2023 1053   PLT 304 03/12/2015 1108   MCV 90.8 03/16/2023 1053   MCV 90 03/12/2015 1108   MCH 29.2 03/16/2023 1053   MCHC 32.1 03/16/2023 1053   RDW 12.3 03/16/2023 1053   RDW 12.8 03/12/2015 1108   LYMPHSABS 0.9 11/02/2018  2014   LYMPHSABS 3.1 12/26/2014 0742   MONOABS 1.4 (H) 11/02/2018 2014   MONOABS 0.9 12/26/2014 0742   EOSABS 0.0 11/02/2018 2014   EOSABS 0.2 12/26/2014 0742   BASOSABS 0.1 11/02/2018 2014   BASOSABS 0.1 12/26/2014 0742    Hgb A1C Lab Results  Component Value Date   HGBA1C 5.3 03/16/2023           Assessment & Plan:   Hemorrhoid:  Avoid constipation by consuming a high-fiber diet and drinking adequate amounts of water Rx for Anusol suppositories 25 mg twice daily x 7 days RTC in 6 months for follow-up of chronic conditions.

## 2023-09-18 NOTE — Assessment & Plan Note (Addendum)
Continue hydrocodone per psychiatry

## 2023-09-19 LAB — COMPLETE METABOLIC PANEL WITH GFR
AG Ratio: 1.6 (calc) (ref 1.0–2.5)
ALT: 14 U/L (ref 6–29)
AST: 22 U/L (ref 10–35)
Albumin: 4.2 g/dL (ref 3.6–5.1)
Alkaline phosphatase (APISO): 87 U/L (ref 31–125)
BUN: 12 mg/dL (ref 7–25)
CO2: 29 mmol/L (ref 20–32)
Calcium: 10.1 mg/dL (ref 8.6–10.2)
Chloride: 100 mmol/L (ref 98–110)
Creat: 0.53 mg/dL (ref 0.50–0.99)
Globulin: 2.7 g/dL (ref 1.9–3.7)
Glucose, Bld: 101 mg/dL — ABNORMAL HIGH (ref 65–99)
Potassium: 3.7 mmol/L (ref 3.5–5.3)
Sodium: 140 mmol/L (ref 135–146)
Total Bilirubin: 0.4 mg/dL (ref 0.2–1.2)
Total Protein: 6.9 g/dL (ref 6.1–8.1)
eGFR: 115 mL/min/{1.73_m2} (ref >=60)

## 2023-09-19 LAB — CBC
HCT: 47.6 % — ABNORMAL HIGH (ref 35.0–45.0)
Hemoglobin: 15.1 g/dL (ref 11.7–15.5)
MCH: 30 pg (ref 27.0–33.0)
MCHC: 31.7 g/dL — ABNORMAL LOW (ref 32.0–36.0)
MCV: 94.4 fL (ref 80.0–100.0)
MPV: 9.2 fL (ref 7.5–12.5)
Platelets: 323 10*3/uL (ref 140–400)
RBC: 5.04 10*6/uL (ref 3.80–5.10)
RDW: 13.1 % (ref 11.0–15.0)
WBC: 12.4 10*3/uL — ABNORMAL HIGH (ref 3.8–10.8)

## 2023-09-19 LAB — HEMOGLOBIN A1C
Hgb A1c MFr Bld: 5.3 %{Hb} (ref <5.7)
Mean Plasma Glucose: 105 mg/dL
eAG (mmol/L): 5.8 mmol/L

## 2023-09-19 LAB — LIPID PANEL
Cholesterol: 230 mg/dL — ABNORMAL HIGH (ref <200)
HDL: 91 mg/dL (ref >=50)
LDL Cholesterol (Calc): 112 mg/dL — ABNORMAL HIGH
Non-HDL Cholesterol (Calc): 139 mg/dL — ABNORMAL HIGH (ref <130)
Total CHOL/HDL Ratio: 2.5 (calc) (ref <5.0)
Triglycerides: 152 mg/dL — ABNORMAL HIGH (ref <150)

## 2023-09-19 LAB — VITAMIN B12: Vitamin B-12: 263 pg/mL (ref 200–1100)

## 2023-09-20 ENCOUNTER — Encounter: Payer: Self-pay | Admitting: Internal Medicine

## 2023-09-21 MED ORDER — CYANOCOBALAMIN 1000 MCG/ML IJ SOLN: 1000.0000 ug | INTRAMUSCULAR | 5 refills | Status: AC

## 2023-11-16 DIAGNOSIS — F902 Attention-deficit hyperactivity disorder, combined type: Secondary | ICD-10-CM | POA: Diagnosis not present

## 2024-01-03 ENCOUNTER — Telehealth: Payer: Self-pay | Admitting: Internal Medicine

## 2024-01-03 NOTE — Telephone Encounter (Signed)
 Copied from CRM [id number]. Topic: Medicare AWV >> Jan 03, 2024 10:18 AM Nathanel DEL wrote: Reason for CRM: Called LVM 01/03/2024 to schedule AWV. Please schedule office or virtual visits.  Nathanel Paschal; Care Guide Ambulatory Clinical Support [place] [facility] Direct Dial: [phone]

## 2024-03-21 ENCOUNTER — Ambulatory Visit: Payer: Self-pay | Admitting: Internal Medicine

## 2024-03-21 NOTE — Progress Notes (Deleted)
 Subjective:    Patient ID: Kathleen Zavala, female    DOB: Apr 06, 1976, 48 y.o.   MRN: [id number]  HPI  Patient presents to clinic today for 63-month follow-up of chronic conditions.  ADD: She reports mainly inattention.  She is taking adderall as prescribed.  She follows with neuropsych.  Frequent headaches: These occur almost daily.  Triggered by traumatic brain injury.  She takes hydrocodone  as prescribed.  She follows with neuropsych.  Anxiety and depression: Chronic, managed on xanax.  She is not currently seeing a therapist but does follow with neuropsychiatry.  She denies SI/HI.  Insomnia: She has difficulty falling and staying asleep.  She is taking ambien as prescribed.  There is no sleep study on file.  She follows with neuropsych.  HLD with aortic atherosclerosis: Her last LDL was 112, triglycerides 152, 08/2023.  She is not taking atorvastatin  or aspirin  as prescribed.  She tries to consume a low-fat diet.   Review of Systems     Past Medical History:  Diagnosis Date   ADD (attention deficit disorder)    Allergy    Amenorrhea    Anxiety    Depression    Headache    Insomnia    Malpositioned IUD    Uterine perforation by intrauterine contraceptive device    UTI (lower urinary tract infection)    Vaginal Pap smear, abnormal      Allergies  Allergen Reactions   Shellfish Allergy     Family History  Problem Relation Age of Onset   Stroke Mother    Healthy Sister    Healthy Sister    Healthy Brother    Breast cancer Maternal Aunt    Ovarian cancer Maternal Aunt    Breast cancer Maternal Aunt    Breast cancer Maternal Grandmother    Diabetes Neg Hx    Heart disease Neg Hx    Colon cancer Neg Hx     Social History   Socioeconomic History   Marital status: Single    Spouse name: Not on file   Number of children: Not on file   Years of education: Not on file   Highest education level: Not on file  Occupational History   Not on file  Tobacco Use    Smoking status: Every Day    Current packs/day: 0.00    Average packs/day: 1 pack/day for 20.0 years (20.0 ttl pk-yrs)    Types: Cigarettes    Start date: 12/17/1996    Last attempt to quit: 12/17/2016    Years since quitting: 7.2   Smokeless tobacco: Never  Vaping Use   Vaping status: Never Used  Substance and Sexual Activity   Alcohol use: Yes    Alcohol/week: 4.0 standard drinks of alcohol    Types: 2 Cans of beer, 2 Shots of liquor per week    Comment: moderate   Drug use: No   Sexual activity: Yes  Other Topics Concern   Not on file  Social History Narrative   Not on file   Social Drivers of Health   Financial Resource Strain: Not on file  Food Insecurity: Not on file  Transportation Needs: Not on file  Physical Activity: Not on file  Stress: Not on file  Social Connections: Not on file  Intimate Partner Violence: Not on file     Constitutional: Patient reports headaches.  Denies fever, malaise, fatigue, or abrupt weight changes.  HEENT: Denies eye pain, eye redness, ear pain, ringing in the ears, wax  buildup, runny nose, nasal congestion, bloody nose, or sore throat. Respiratory: Denies difficulty breathing, shortness of breath, cough or sputum production.   Cardiovascular: Denies chest pain, chest tightness, palpitations or swelling in the hands or feet.  Gastrointestinal: Denies abdominal pain, bloating, constipation, diarrhea or blood in the stool.  GU: Patient reports hemorrhoid.  Denies urgency, frequency, pain with urination, burning sensation, blood in urine, odor or discharge. Musculoskeletal: Denies decrease in range of motion, difficulty with gait, muscle pain or joint pain and swelling.  Skin: Denies redness, rashes, lesions or ulcercations.  Neurological: Patient reports inattention, insomnia.  Denies dizziness, difficulty with memory, difficulty with speech or problems with balance and coordination.  Psych: Patient has a history of anxiety and  depression.  Denies SI/HI.  No other specific complaints in a complete review of systems (except as listed in HPI above).  Objective:   Physical Exam   There were no vitals taken for this visit.  Wt Readings from Last 3 Encounters:  09/18/23 140 lb (63.5 kg)  03/16/23 159 lb 14.4 oz (72.5 kg)  12/13/18 139 lb (63 kg)    General: Appears her stated age, well developed, well nourished in NAD. Skin: Warm, dry and intact.  HEENT: Head: normal shape and size; Eyes: sclera white, no icterus, conjunctiva pink, PERRLA and EOMs intact;  Cardiovascular: Normal rate and rhythm. S1,S2 noted.  No murmur, rubs or gallops noted. No JVD or BLE edema. Pulmonary/Chest: Normal effort and positive vesicular breath sounds. No respiratory distress. No wheezes, rales or ronchi noted.  Musculoskeletal: No difficulty with gait.  Neurological: Alert and oriented. Coordination normal.  Psychiatric: Mood and affect normal. Behavior is normal. Judgment and thought content normal.    BMET    Component Value Date/Time   NA 140 09/18/2023 1242   NA 136 12/26/2014 0742   K 3.7 09/18/2023 1242   K 4.4 12/26/2014 0742   CL 100 09/18/2023 1242   CL 107 12/26/2014 0742   CO2 29 09/18/2023 1242   CO2 24 12/26/2014 0742   GLUCOSE 101 (H) 09/18/2023 1242   GLUCOSE 92 12/26/2014 0742   BUN 12 09/18/2023 1242   BUN 2 (L) 12/26/2014 0742   CREATININE 0.53 09/18/2023 1242   CALCIUM  10.1 09/18/2023 1242   CALCIUM  8.7 12/26/2014 0742   GFRNONAA >60 11/02/2018 2014   GFRNONAA >60 12/26/2014 0742   GFRAA >60 11/02/2018 2014   GFRAA >60 12/26/2014 0742    Lipid Panel     Component Value Date/Time   CHOL 230 (H) 09/18/2023 1242   TRIG 152 (H) 09/18/2023 1242   HDL 91 09/18/2023 1242   CHOLHDL 2.5 09/18/2023 1242   VLDL 13.4 12/13/2018 0944   LDLCALC 112 (H) 09/18/2023 1242    CBC    Component Value Date/Time   WBC 12.4 (H) 09/18/2023 1242   RBC 5.04 09/18/2023 1242   HGB 15.1 09/18/2023 1242   HGB  14.2 03/12/2015 1108   HCT 47.6 (H) 09/18/2023 1242   HCT 41.3 03/12/2015 1108   PLT 323 09/18/2023 1242   PLT 304 03/12/2015 1108   MCV 94.4 09/18/2023 1242   MCV 90 03/12/2015 1108   MCH 30.0 09/18/2023 1242   MCHC 31.7 (L) 09/18/2023 1242   RDW 13.1 09/18/2023 1242   RDW 12.8 03/12/2015 1108   LYMPHSABS 0.9 11/02/2018 2014   LYMPHSABS 3.1 12/26/2014 0742   MONOABS 1.4 (H) 11/02/2018 2014   MONOABS 0.9 12/26/2014 0742   EOSABS 0.0 11/02/2018 2014   EOSABS  0.2 12/26/2014 0742   BASOSABS 0.1 11/02/2018 2014   BASOSABS 0.1 12/26/2014 0742    Hgb A1C Lab Results  Component Value Date   HGBA1C 5.3 09/18/2023           Assessment & Plan:    RTC in 6 months for follow-up of chronic conditions. Helayne Lo, NP

## 2024-03-26 DIAGNOSIS — F902 Attention-deficit hyperactivity disorder, combined type: Secondary | ICD-10-CM | POA: Diagnosis not present

## 2024-06-25 DIAGNOSIS — F411 Generalized anxiety disorder: Secondary | ICD-10-CM | POA: Diagnosis not present

## 2024-06-25 DIAGNOSIS — F901 Attention-deficit hyperactivity disorder, predominantly hyperactive type: Secondary | ICD-10-CM | POA: Diagnosis not present

## 2024-06-25 DIAGNOSIS — Z79899 Other long term (current) drug therapy: Secondary | ICD-10-CM | POA: Diagnosis not present

## 2024-06-25 DIAGNOSIS — F902 Attention-deficit hyperactivity disorder, combined type: Secondary | ICD-10-CM | POA: Diagnosis not present

## 2024-07-21 ENCOUNTER — Emergency Department

## 2024-07-21 ENCOUNTER — Emergency Department
Admission: EM | Admit: 2024-07-21 | Discharge: 2024-07-21 | Disposition: A | Discharge status: Home / Self Care | Attending: Emergency Medicine | Admitting: Emergency Medicine

## 2024-07-21 DIAGNOSIS — R079 Chest pain, unspecified: Secondary | ICD-10-CM | POA: Diagnosis not present

## 2024-07-21 DIAGNOSIS — I700 Atherosclerosis of aorta: Secondary | ICD-10-CM | POA: Diagnosis not present

## 2024-07-21 DIAGNOSIS — R0789 Other chest pain: Secondary | ICD-10-CM | POA: Insufficient documentation

## 2024-07-21 DIAGNOSIS — M791 Myalgia, unspecified site: Secondary | ICD-10-CM | POA: Diagnosis not present

## 2024-07-21 LAB — BASIC METABOLIC PANEL WITH GFR
Anion gap: 12 (ref 5–15)
BUN: 12 mg/dL (ref 6–20)
CO2: 24 mmol/L (ref 22–32)
Calcium: 9.5 mg/dL (ref 8.9–10.3)
Chloride: 100 mmol/L (ref 98–111)
Creatinine, Ser: 0.68 mg/dL (ref 0.44–1.00)
GFR, Estimated: >60 mL/min (ref >60)
Glucose, Bld: 136 mg/dL — ABNORMAL HIGH (ref 70–99)
Potassium: 3.5 mmol/L (ref 3.5–5.1)
Sodium: 136 mmol/L (ref 135–145)

## 2024-07-21 LAB — CBC
HCT: 41 % (ref 36.0–46.0)
Hemoglobin: 14.5 g/dL (ref 12.0–15.0)
MCH: 31.9 pg (ref 26.0–34.0)
MCHC: 35.4 g/dL (ref 30.0–36.0)
MCV: 90.3 fL (ref 80.0–100.0)
Platelets: 338 K/uL (ref 150–400)
RBC: 4.54 MIL/uL (ref 3.87–5.11)
RDW: 12.5 % (ref 11.5–15.5)
WBC: 11.5 K/uL — ABNORMAL HIGH (ref 4.0–10.5)
nRBC: 0 % (ref 0.0–0.2)

## 2024-07-21 LAB — TROPONIN I (HIGH SENSITIVITY): Troponin I (High Sensitivity): 4 ng/L (ref <18)

## 2024-07-21 MED ORDER — CYCLOBENZAPRINE HCL 5 MG PO TABS: 5.0000 mg | ORAL_TABLET | Freq: Three times a day (TID) | ORAL | 0 refills | Status: AC | PRN

## 2024-07-21 MED ORDER — LIDOCAINE 5 % EX PTCH
1.0000 | MEDICATED_PATCH | Freq: Once | CUTANEOUS | Status: DC
  Administered 2024-07-21: 1 via TRANSDERMAL
  Filled 2024-07-21: qty 1

## 2024-07-21 MED ORDER — LIDOCAINE 5 % EX PTCH: 1.0000 | MEDICATED_PATCH | Freq: Two times a day (BID) | CUTANEOUS | 0 refills | Status: AC | PRN

## 2024-07-21 NOTE — Discharge Instructions (Addendum)
 Your exam, labs, EKG, and chest x-ray are all normal or reassuring.  Your symptoms seem to represent a musculoskeletal strain over the chest wall muscles.  Take the prescription muscle relaxants as needed.  Use the lidocaine  patches every 12 hours as needed for chest wall pain.  Follow-up with your primary provider or return to ED if needed.

## 2024-07-21 NOTE — ED Triage Notes (Signed)
 Pt presents to the ED via pov from home. Pt reports right breast pain x2 days. Pt reports that the pain is reproducible with palpation. Reports that the pain goes up into her chest and lower rib cage. Pt A&Ox4.

## 2024-07-21 NOTE — ED Provider Notes (Signed)
 [facility] Emergency Department Provider Note     Event Date/Time   First MD Initiated Contact with Patient 07/21/24 1711     (approximate)   History   Shortness of Breath   HPI  Kathleen Zavala is a 48 y.o. female with a history of ADD, anxiety, depression, and obesity, presents to the ED from home.  Patient is endorsing 2 days of intermittent right breast pain.  She reports reproducible pain with palpation over the breast.  She notes some referral to the pain into her chest and lower rib cage.  No recent injury, trauma, or fall.  No skin changes over the breast are reported.  No nipple discharge or bleeding noted.  Patient denies any frank central chest pain or shortness of breath.  No diaphoresis, nausea, vomiting, dizziness, weakness reported.   Physical Exam   Triage Vital Signs: ED Triage Vitals  Encounter Vitals Group     BP 07/21/24 1441 137/80     Girls Systolic BP Percentile --      Girls Diastolic BP Percentile --      Boys Systolic BP Percentile --      Boys Diastolic BP Percentile --      Pulse Rate 07/21/24 1441 98     Resp 07/21/24 1441 20     Temp 07/21/24 1441 98.3 F (36.8 C)     Temp src --      SpO2 07/21/24 1441 98 %     Weight 07/21/24 1442 123 lb (55.8 kg)     Height 07/21/24 1442 4' 10 (1.473 m)     Head Circumference --      Peak Flow --      Pain Score 07/21/24 1448 8     Pain Loc --      Pain Education --      Exclude from Growth Chart --     Most recent vital signs: Vitals:   07/21/24 1441  BP: 137/80  Pulse: 98  Resp: 20  Temp: 98.3 F (36.8 C)  SpO2: 98%    General Awake, no distress. NAD HEENT NCAT. PERRL. EOMI. No rhinorrhea. Mucous membranes are moist.  CV:  Good peripheral perfusion. RRR RESP:  Normal effort. CTA. No dyskinetic chest wall movement is noted. ABD:  No distention.  BREAST: Right breast without any obvious deformity or skin changes noted.  No peau d'orange appearance.  No nipple  retraction or discharge noted.  Tender to palpation to the inferior breast fold along the rib border.  The mid upper midline to the mid axillary line.  Pain is reproducible with palpation as well as chest wall movement.  No skin, temp, or color changes noted.  Hypertrophic glandular tissue of the right breast consistent with fibrocystic changes noted  ED Results / Procedures / Treatments   Labs (all labs ordered are listed, but only abnormal results are displayed) Labs Reviewed  BASIC METABOLIC PANEL WITH GFR - Abnormal; Notable for the following components:      Result Value   Glucose, Bld 136 (*)    All other components within normal limits  CBC - Abnormal; Notable for the following components:   WBC 11.5 (*)    All other components within normal limits  POC URINE PREG, ED  TROPONIN I (HIGH SENSITIVITY)    EKG  Vent. rate 106 BPM PR interval 122 ms QRS duration 86 ms QT/QTcB 336/446 ms P-R-T axes 58 75 50 Sinus tachycardia Otherwise normal ECG  No STEMI  RADIOLOGY  I personally viewed and evaluated these images as part of my medical decision making, as well as reviewing the written report by the radiologist.  ED Provider Interpretation: No acute findings  DG Chest 2 View Result Date: 07/21/2024 CLINICAL DATA:  Chest pain. EXAM: CHEST - 2 VIEW COMPARISON:  11/02/2018. FINDINGS: The heart size and mediastinal contours are within normal limits. There is atherosclerotic calcification of the aorta. Surgical clips are noted in the right axilla. A stable radiopaque density is noted over the right upper chest. No consolidation, effusion, or pneumothorax is seen. Degenerative changes are present in the thoracic spine. No acute osseous abnormality. IMPRESSION: No active cardiopulmonary disease. Electronically Signed   By: Leita Birmingham M.D.   On: 07/21/2024 16:10     PROCEDURES:  Critical Care performed: No  Procedures   MEDICATIONS ORDERED IN ED: Medications  lidocaine   (LIDODERM ) 5 % 1 patch (has no administration in time range)     IMPRESSION / MDM / ASSESSMENT AND PLAN / ED COURSE  I reviewed the triage vital signs and the nursing notes.                              Differential diagnosis includes, but is not limited to, ACS, aortic dissection, pulmonary embolism, cardiac tamponade, pneumothorax, pneumonia, pericarditis, myocarditis, GI-related causes including esophagitis/gastritis, and musculoskeletal chest wall pain.     Patient's presentation is most consistent with acute complicated illness / injury requiring diagnostic workup.  Patient's diagnosis is consistent with chest wall pain on the right.  Patient with what appears to be musculoskeletal chest wall pain with reproducible pain with tenderness and aggravated by upper extremity range of motion.  No signs of any acute cardiac related syndrome including ACS or MI.  Patient labs are reassuring including a normal troponin.  No EKG evidence of malignant arrhythmia.  Chest x-ray interpreted by me, shows no acute intrathoracic process.  No acute breast changes concerning for inflammatory or malignant breast disease.  Patient will be discharged home with prescriptions for Flexeril  and Lidoderm  patches. Patient is to follow up with her primary provider as needed or otherwise directed. Patient is given ED precautions to return to the ED for any worsening or new symptoms.   FINAL CLINICAL IMPRESSION(S) / ED DIAGNOSES   Final diagnoses:  Right-sided chest wall pain  Muscle pain     Rx / DC Orders   ED Discharge Orders          Ordered    cyclobenzaprine  (FLEXERIL ) 5 MG tablet  3 times daily PRN        07/21/24 1735    lidocaine  (LIDODERM ) 5 %  Every 12 hours PRN        07/21/24 1737             Note:  This document was prepared using Dragon voice recognition software and may include unintentional dictation errors.    Loyd Candida LULLA Aldona, PA-C 07/21/24 1749    Arlander Charleston,  MD 07/21/24 [phone]

## 2024-09-10 DIAGNOSIS — F902 Attention-deficit hyperactivity disorder, combined type: Secondary | ICD-10-CM | POA: Diagnosis not present
